# Patient Record
Sex: Female | Born: 1970 | Race: White | Hispanic: No | Marital: Married | Smoking: Former smoker
Health system: Southern US, Community
[De-identification: ages and names within clinical notes are randomized; demographics above are authoritative.]

## PROBLEM LIST (undated history)

## (undated) DIAGNOSIS — M1611 Unilateral primary osteoarthritis, right hip: Secondary | ICD-10-CM

## (undated) DIAGNOSIS — F53 Postpartum depression: Secondary | ICD-10-CM

## (undated) DIAGNOSIS — O99345 Other mental disorders complicating the puerperium: Secondary | ICD-10-CM

## (undated) HISTORY — DX: Other mental disorders complicating the puerperium: O99.345

## (undated) HISTORY — PX: LEEP: SHX91

## (undated) HISTORY — PX: OTHER SURGICAL HISTORY: SHX169

## (undated) HISTORY — DX: Postpartum depression: F53.0

## (undated) HISTORY — DX: Unilateral primary osteoarthritis, right hip: M16.11

---

## 2007-11-16 ENCOUNTER — Ambulatory Visit: Payer: Self-pay | Admitting: Family Medicine

## 2007-11-16 DIAGNOSIS — L851 Acquired keratosis [keratoderma] palmaris et plantaris: Secondary | ICD-10-CM | POA: Insufficient documentation

## 2007-11-16 DIAGNOSIS — K625 Hemorrhage of anus and rectum: Secondary | ICD-10-CM | POA: Insufficient documentation

## 2007-11-16 DIAGNOSIS — IMO0002 Reserved for concepts with insufficient information to code with codable children: Secondary | ICD-10-CM

## 2007-11-16 DIAGNOSIS — L259 Unspecified contact dermatitis, unspecified cause: Secondary | ICD-10-CM | POA: Insufficient documentation

## 2007-11-16 DIAGNOSIS — D509 Iron deficiency anemia, unspecified: Secondary | ICD-10-CM | POA: Insufficient documentation

## 2007-11-16 DIAGNOSIS — R42 Dizziness and giddiness: Secondary | ICD-10-CM

## 2007-11-17 ENCOUNTER — Encounter: Payer: Self-pay | Admitting: Family Medicine

## 2007-11-18 ENCOUNTER — Encounter: Payer: Self-pay | Admitting: Family Medicine

## 2007-11-25 LAB — CONVERTED CEMR LAB
Albumin: 4.4 g/dL (ref 3.5–5.2)
BUN: 12 mg/dL (ref 6–23)
CO2: 22 meq/L (ref 19–32)
Cholesterol: 206 mg/dL — ABNORMAL HIGH (ref 0–200)
Glucose, Bld: 91 mg/dL (ref 70–99)
HDL: 49 mg/dL (ref >39)
Hemoglobin: 12.6 g/dL (ref 12.0–15.0)
MCHC: 32.1 g/dL (ref 30.0–36.0)
MCV: 88.1 fL (ref 78.0–100.0)
Potassium: 4.3 meq/L (ref 3.5–5.3)
Sodium: 139 meq/L (ref 135–145)
Total Bilirubin: 0.5 mg/dL (ref 0.3–1.2)
Total Protein: 7.4 g/dL (ref 6.0–8.3)
Triglycerides: 78 mg/dL (ref <150)
WBC: 7.4 10*3/uL (ref 4.0–10.5)

## 2008-01-15 ENCOUNTER — Ambulatory Visit: Payer: Self-pay | Admitting: Family Medicine

## 2008-02-01 ENCOUNTER — Telehealth (INDEPENDENT_AMBULATORY_CARE_PROVIDER_SITE_OTHER): Payer: Self-pay | Admitting: *Deleted

## 2008-02-02 ENCOUNTER — Encounter: Admission: RE | Admit: 2008-02-02 | Discharge: 2008-02-02 | Payer: Self-pay | Admitting: Family Medicine

## 2008-02-02 ENCOUNTER — Ambulatory Visit: Payer: Self-pay | Admitting: Family Medicine

## 2008-05-25 ENCOUNTER — Ambulatory Visit: Payer: Self-pay | Admitting: Family Medicine

## 2009-01-11 ENCOUNTER — Ambulatory Visit: Payer: Self-pay | Admitting: Obstetrics & Gynecology

## 2009-01-18 ENCOUNTER — Encounter: Admission: RE | Admit: 2009-01-18 | Discharge: 2009-01-18 | Payer: Self-pay | Admitting: Family Medicine

## 2009-01-18 ENCOUNTER — Ambulatory Visit: Payer: Self-pay | Admitting: Family Medicine

## 2009-01-18 DIAGNOSIS — M79609 Pain in unspecified limb: Secondary | ICD-10-CM | POA: Insufficient documentation

## 2009-01-26 ENCOUNTER — Encounter: Payer: Self-pay | Admitting: Family Medicine

## 2009-03-15 ENCOUNTER — Ambulatory Visit: Payer: Self-pay | Admitting: Family Medicine

## 2009-03-15 DIAGNOSIS — M546 Pain in thoracic spine: Secondary | ICD-10-CM

## 2009-03-15 DIAGNOSIS — J020 Streptococcal pharyngitis: Secondary | ICD-10-CM

## 2009-03-29 ENCOUNTER — Encounter: Admission: RE | Admit: 2009-03-29 | Discharge: 2009-04-10 | Payer: Self-pay | Admitting: Family Medicine

## 2009-03-31 ENCOUNTER — Encounter: Payer: Self-pay | Admitting: Family Medicine

## 2009-04-07 ENCOUNTER — Encounter: Payer: Self-pay | Admitting: Family Medicine

## 2009-04-14 ENCOUNTER — Telehealth (INDEPENDENT_AMBULATORY_CARE_PROVIDER_SITE_OTHER): Payer: Self-pay | Admitting: *Deleted

## 2009-05-29 ENCOUNTER — Ambulatory Visit: Payer: Self-pay | Admitting: Family Medicine

## 2009-05-29 DIAGNOSIS — J309 Allergic rhinitis, unspecified: Secondary | ICD-10-CM | POA: Insufficient documentation

## 2009-12-18 ENCOUNTER — Ambulatory Visit: Payer: Self-pay | Admitting: Family Medicine

## 2010-02-06 NOTE — Consult Note (Signed)
Summary: [facility]  [facility]   Imported By: Lanelle Bal 02/08/2009 08:31:09  _____________________________________________________________________  External Attachment:    Type:   Image     Comment:   External Document

## 2010-02-06 NOTE — Assessment & Plan Note (Signed)
Summary: strep/ back pain   Vital Signs:  Patient profile:   40 year old female Height:      62 inches Weight:      167 pounds BMI:     30.66 O2 Sat:      98 % on Room air Temp:     99.5 degrees F oral Pulse rate:   83 / minute BP sitting:   107 / 71  (left arm) Cuff size:   regular  Vitals Entered By: Payton Spark CMA (March 15, 2009 10:53 AM)  O2 Flow:  Room air CC: Back pain down spine x 1.5 weeks. Also c/o ST and achy ears x 1 day, Back Pain Pain Assessment Patient in pain? yes     Location: back Intensity: 8   Primary Care Provider:  Seymour Bars DO  CC:  Back pain down spine x 1.5 weeks. Also c/o ST and achy ears x 1 day and Back Pain.  History of Present Illness: 40 yo WF presents for back pain and 1 day of subjective fevers, sore throat pain and fatigue.  No cough or congestion.  No strep exposure at home.         This is a 40 year old woman who presents with Back Pain.  The symptoms began 2 weeks ago.  The intensity is described as an 8 out of 10.  She was laying on the floor doing exercises and back pain started.  It was so bad, she almost passed out.  It has improved since then.  She did start Zumba recently and notes that her back hurts afterwards everytime.  She does stretch after Zumba Class.  She is taking only Tylenol.  The patient denies weakness, loss of sensation, urinary retention, and dysuria.  The pain began at home and suddenly.  The pain is made worse by flexion and inactivity.  The pain is made better by acetaminophen and heat.    Current Medications (verified): 1)  Pre-Natal  Tabs (Prenatal Multivit-Min-Fe-Fa) .... Take 1 Tablet By Mouth Three Times A Day 2)  Omega-3 & Omega-6 Fish Oil  Caps (Omega 3-6-9 Fatty Acids) .... Take 1 Tablet By Mouth Three Times A Day 3)  Skeletal Strength .... Take 1 Tablet By Mouth Three Times A Day 4)  Vitamin D 1000 Unit Tabs (Cholecalciferol) .... Take 1 Tab Once Daily  Allergies (verified): No Known Drug  Allergies  Past History:  Past Surgical History: Last updated: 11/16/2007 R TM graft after a perf in 2000  Social History: Last updated: 01/18/2009 [facility] with 4  kids (daughters 84 and 9), sons 2 & infant Married. Quit smoking 2004. 7 beers/ wk Cardio 2 x a wk. pull out method for birth control.  Review of Systems      See HPI  Physical Exam  General:  alert, well-nourished, well-hydrated, and overweight-appearing.   Head:  normocephalic and atraumatic.   Eyes:  conjunctiva clear Ears:  EACs patent; TMs translucent and gray with good cone of light and bony landmarks. R side TM scar, old Nose:  no nasal discharge.   Mouth:  good dentition and pharynx pink and moist.  o/p injected Neck:  no masses.   Lungs:  Normal respiratory effort, chest expands symmetrically. Lungs are clear to auscultation, no crackles or wheezes. Heart:  Normal rate and regular rhythm. S1 and S2 normal without gallop, murmur, click, rub or other extra sounds. Msk:  tender over L>R mid to lower thoracic musculature with full L  and T spine active ROM.  REproducible tenderness to touch with slight muscle spasm and with resisted arm movement. Pulses:  2+ radial pulses Neurologic:  gait normal and DTRs symmetrical and normal.   Skin:  color normal and no rashes.   Cervical Nodes:  shotty anterior cervical chain LA   Impression & Recommendations:  Problem # 1:  BACK PAIN, THORACIC REGION (ICD-724.1) MSK thoracic back pain from doing new exercises.  She is nursing which limits some medication options. Treat with NSAIDs, heat, icy hot and add PT to work on improving home exercise program w/o causing excess strain. Orders: Physical Therapy Referral (PT)  Problem # 2:  STREPTOCOCCAL SORE THROAT (ICD-034.0) Rapid strep +.  Treat with Pen VK x 10 days.  Call if symptoms are not improving in the next 4-5 days. Continue supportive care and avoid contact spread. Her updated medication list for this problem  includes:    Penicillin V Potassium 500 Mg Tabs (Penicillin v potassium) .Marland Kitchen... 1 tab by mouth two times a day x 10 days  Complete Medication List: 1)  Pre-natal Tabs (Prenatal multivit-min-fe-fa) .... Take 1 tablet by mouth three times a day 2)  Omega-3 & Omega-6 Fish Oil Caps (Omega 3-6-9 fatty acids) .... Take 1 tablet by mouth three times a day 3)  Skeletal Strength  .... Take 1 tablet by mouth three times a day 4)  Vitamin D 1000 Unit Tabs (Cholecalciferol) .... Take 1 tab once daily 5)  Penicillin V Potassium 500 Mg Tabs (Penicillin v potassium) .Marland Kitchen.. 1 tab by mouth two times a day x 10 days  Other Orders: Rapid Strep ([address])  Patient Instructions: 1)  Take Pen VK 2 x a day for strep throat for the full 10 days. 2)  Use ALEVE with Breakfast and dinner for both sore throat pain and back pain. 3)  Use heating pad, icy hot and will get you in with PT down the hall. 4)  Call if sore throat is not improving by Monday. Prescriptions: PENICILLIN V POTASSIUM 500 MG TABS (PENICILLIN V POTASSIUM) 1 tab by mouth two times a day x 10 days  #20 x 0   Entered and Authorized by:   Seymour Bars DO   Signed by:   Seymour Bars DO on 03/15/2009   Method used:   Electronically to        [id number] (retail)       [address]       [place], [place]  [address]       Ph: [phone]       Fax: [phone]   RxID:   [id number]   Laboratory Results    Other Tests  Rapid Strep: positive

## 2010-02-06 NOTE — Miscellaneous (Signed)
Summary: PT Initial Summary/[facility]  PT Initial Summary/[facility]   Imported By: Lanelle Bal 04/06/2009 08:59:10  _____________________________________________________________________  External Attachment:    Type:   Image     Comment:   External Document

## 2010-02-06 NOTE — Assessment & Plan Note (Signed)
Summary: CPE   Vital Signs:  Patient profile:   40 year old female Height:      62 inches Weight:      163 pounds BMI:     29.92 O2 Sat:      98 % on Room air Pulse rate:   61 / minute BP sitting:   110 / 64  (left arm) Cuff size:   regular  Vitals Entered By: Payton Spark CMA (May 29, 2009 10:42 AM)  O2 Flow:  Room air CC: CPE w/ out pap   Primary Care Provider:  Seymour Bars DO  CC:  CPE w/ out pap.  History of Present Illness: 40 yo WF presents for CPE w/o pap smear.  She is G4P4.  She is struggling with her allergies.  Having a lot of ocular symptoms.  Taking Claritin but it's not helping much.  Unsure last Tdap vaccine.  Sees Dr Marice Potter for pap smears.  Denies fam hx of breastk or colon cancer.  Denies fam hx of premature heart dz.  Due for fasting labs.  Vasectomy for birth control.    Current Medications (verified): 1)  Pre-Natal  Tabs (Prenatal Multivit-Min-Fe-Fa) .... Take 1 Tablet By Mouth Three Times A Day 2)  Omega-3 & Omega-6 Fish Oil  Caps (Omega 3-6-9 Fatty Acids) .... Take 1 Tablet By Mouth Three Times A Day 3)  Skeletal Strength .... Take 1 Tablet By Mouth Three Times A Day  Allergies (verified): No Known Drug Allergies  Past History:  Past Medical History: Reviewed history from 01/18/2009 and no changes required. Breeze.Bergeron  Family History: Reviewed history from 11/16/2007 and no changes required. father healthy mother skin cancer (non melanoma) 2 sisters and 1 brother healthy  Social History: Reviewed history from 01/18/2009 and no changes required. 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 Quit smoking 2004. 7 beers/ wk Cardio 2 x a wk. pull out method for birth control.  Review of Systems      See HPI  The patient denies anorexia, fever, weight loss, weight gain, vision loss, decreased hearing, hoarseness, chest pain, syncope, dyspnea on exertion, peripheral edema, prolonged cough, headaches, hemoptysis, abdominal pain,  melena, hematochezia, severe indigestion/heartburn, hematuria, incontinence, genital sores, muscle weakness, suspicious skin lesions, transient blindness, difficulty walking, depression, unusual weight change, abnormal bleeding, enlarged lymph nodes, angioedema, breast masses, and testicular masses.    Physical Exam  General:  alert, well-developed, well-nourished, well-hydrated, and overweight-appearing.   Head:  normocephalic and atraumatic.   Eyes:  bilat sclera injection with watery eyes Ears:  scarring on R TM; normal L TM Nose:  nasal congestion present.   Mouth:  good dentition and pharynx pink and moist.   Neck:  no masses.   Lungs:  Normal respiratory effort, chest expands symmetrically. Lungs are clear to auscultation, no crackles or wheezes. Heart:  Normal rate and regular rhythm. S1 and S2 normal without gallop, murmur, click, rub or other extra sounds. Abdomen:  soft, non-tender, normal bowel sounds, no distention, no masses, and no guarding.   Pulses:  2+ radial and pedal pulses Extremities:  no LE edema Skin:  color normal and no suspicious lesions.   Cervical Nodes:  No lymphadenopathy noted Psych:  good eye contact, not anxious appearing, and not depressed appearing.     Impression & Recommendations:  Problem # 1:  HEALTH MAINTENANCE EXAM (ICD-V70.0) Keeping healthy checklist for women reviewed. BP at goal.  BMI 29 c/w overwt. Paps done  by Dr Marice Potter. Mammogram at 40. Colon cancer screening at 40. Tdap declined today. Update fasting labs, order printed. Work on [employer], regular exercise, wt loss, MVI and calcium/ D daily. Contraception: vasectomy.  Problem # 2:  ALLERGIC RHINITIS (ICD-477.9) With acute seasonal flare ups. Change Claritin to Allegra OTC and add Zaditor drops for allergy eyes.  Complete Medication List: 1)  Pre-natal Tabs (Prenatal multivit-min-fe-fa) .... Take 1 tablet by mouth three times a day 2)  Omega-3 & Omega-6 Fish Oil Caps (Omega 3-6-9  fatty acids) .... Take 1 tablet by mouth three times a day 3)  Skeletal Strength  .... Take 1 tablet by mouth three times a day  Other Orders: T-Lipid Profile ([id number]) T-Comprehensive Metabolic Panel ([id number])  Patient Instructions: 1)  OK to change Claritin to Allegra (OTC) daily for allergies. 2)  Add OTC ZADITOR DROPS for allergy eyes. 3)  Return for follow up as needed.

## 2010-02-06 NOTE — Miscellaneous (Signed)
Summary: PT Discharge/[facility]  PT Discharge/[facility]   Imported By: Lanelle Bal 04/17/2009 10:26:17  _____________________________________________________________________  External Attachment:    Type:   Image     Comment:   External Document

## 2010-02-06 NOTE — Progress Notes (Signed)
Summary: Allergy med?  Phone Note Call from Patient   Caller: Patient Summary of Call: Pt has bad allergies and would like to know what she can take for allergies while she is breast feeding. Please advise.  Initial call taken by: Payton Spark CMA,  April 14, 2009 11:22 AM  Follow-up for Phone Call        Claritin is safe and is OTC and once a day.  Follow-up by: Nani Gasser MD,  April 14, 2009 11:24 AM  Additional Follow-up for Phone Call Additional follow up Details #1::        Pt aware Additional Follow-up by: Payton Spark CMA,  April 14, 2009 11:26 AM

## 2010-02-06 NOTE — Assessment & Plan Note (Signed)
Summary: L foot pain   Vital Signs:  Patient profile:   40 year old female Height:      62 inches Weight:      175 pounds BMI:     32.12 O2 Sat:      97 % on Room air Temp:     98.4 degrees F oral Pulse rate:   79 / minute BP sitting:   106 / 68  (left arm) Cuff size:   regular  Vitals Entered By: Payton Spark CMA (January 18, 2009 10:26 AM)  O2 Flow:  Room air CC: L foot pain    Primary Care Provider:  Seymour Bars DO  CC:  L foot pain .  History of Present Illness: 40 yo WF presents for L foot pain x 5 months.  Denies any trauma.  Denies previous injury.  It hurts worse after exercise.  It hurts first thing in the morning.  Denies joint pain.  She has started exercising again.  She did not gain much weight w/ pregnancy.  Her baby was born 4 months ago, Ivin Booty.  No brusing, no redness, no swelling. No pain in the middle of the night. Has not tried anything.   Current Medications (verified): 1)  Pre-Natal  Tabs (Prenatal Multivit-Min-Fe-Fa) .... Take 1 Tablet By Mouth Three Times A Day 2)  Omega-3 & Omega-6 Fish Oil  Caps (Omega 3-6-9 Fatty Acids) .... Take 1 Tablet By Mouth Three Times A Day 3)  Ix 450mg  .... Take 1 Tablet By Mouth Three Times A Day 4)  Skeletal Strength .... Take 1 Tablet By Mouth Three Times A Day 5)  Vitamin D 1000 Unit Tabs (Cholecalciferol) .... Take 1 Tab Once Daily  Allergies (verified): No Known Drug Allergies  Past History:  Past Medical History: G4P4  Past Surgical History: Reviewed history from 11/16/2007 and no changes required. 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 Quit smoking 2004. 7 beers/ wk Cardio 2 x a wk. pull out method for birth control.  Review of Systems      See HPI  Physical Exam  General:  alert, well-developed, well-nourished, and well-hydrated.  obese Head:  normocephalic and atraumatic.   Mouth:  good dentition and pharynx  pink and moist.   Msk:  tender at the L ATF ligament. no bruising, redness or edema. neg L ankle anterior drawer test. Dropped longitudinal arch with normal gait. No ankle laxity or point tenderness. Neg heel squeeze. fat herniation L heel Skin:  color normal.     Impression & Recommendations:  Problem # 1:  FOOT PAIN, LEFT (ICD-729.5) Non- traumatic L foot pain at rest x 6 months likely to be functional but given wt gain from pregnancy and her wanting to start back to vigorous exercise, will r/o a stress fx with an Xray today.  If normal, I will get her in with podiatry to have orthotics made as it appears with her longitudinal arch dropped that she is over supinating.  Orders: T-DG Foot 2 Views*L* ([address])  Complete Medication List: 1)  Pre-natal Tabs (Prenatal multivit-min-fe-fa) .... Take 1 tablet by mouth three times a day 2)  Omega-3 & Omega-6 Fish Oil Caps (Omega 3-6-9 fatty acids) .... Take 1 tablet by mouth three times a day 3)  Ix 450mg   .... Take 1 tablet by mouth three times a day 4)  Skeletal Strength  .... Take 1  tablet by mouth three times a day 5)  Vitamin D 1000 Unit Tabs (Cholecalciferol) .... Take 1 tab once daily  Patient Instructions: 1)  Xray foot today. 2)  Will call you w/ results. 3)  If negative for fracture, will get you in with podiatry to have orthotics made. 4)  Nutritionist #

## 2010-05-22 NOTE — Assessment & Plan Note (Signed)
NAME:  ELLENOR, WISNIEWSKI NO.:  [id number]   MEDICAL RECORD NO.:  [id number]          PATIENT TYPE:  POB   LOCATION:  [facility] [place]         FACILITY:  [facility]   PHYSICIAN:  Allie Bossier, MD        DATE OF BIRTH:  1970/07/12   DATE OF SERVICE:                                  CLINIC NOTE   IDENTIFICATION:  Ms. Moultry is a 40 year old married white gravida 4,  para 4 with children ranging in ages from 3-1/2 months to 38 years of  age.  She comes in here for an annual exam.  She has no particular  complaints.   PAST MEDICAL HISTORY:  She is obese, but denies other medical problems.   PAST SURGICAL HISTORY:  She had a LEEP in 1993.   SOCIAL HISTORY:  She reports 1-2 beers per week.  Denies tobacco or  illegal drug use.   ALLERGIES:  No latex allergies.  No known drug allergies.   MEDICATIONS:  She is taking a multivitamin daily along with Skeletal  Strength vitamin and omega 3 vitamins.   FAMILY HISTORY:  Negative for breast, GYN and colon malignancies.   REVIEW OF SYSTEMS:  She has been married for 13 years.  She denies  dyspareunia.  She is a Futures trader.  She has had 2 home births.  Her  husband had a vasectomy recently and they are using condoms at the  present time.  Her last Pap smear was in approximately 2008 done at  [facility].   PHYSICAL EXAMINATION:  GENERAL:  Well-nourished, well-hydrated white  female.  VITAL SIGNS:  Height 5 feet 1 inches, weight 177 pounds, blood pressure  125/78, pulse 84.  HEENT:  Normal.  HEART:  Regular rate and rhythm.  LUNGS:  Clear to auscultation bilaterally.  BREASTS:  Normal breastfeeding breast.  ABDOMEN:  Obese.  No palpable hepatosplenomegaly.  PELVIC:  External genitalia, no lesions.  Cervix; parous, no lesions,  normal discharge.  Uterus; mid plane, difficult to exam secondary to  centripetal obesity.  Adnexa, no masses and nontender.   ASSESSMENT AND PLAN:  Annual exam.  I have checked Pap smear and  recommended self-breast and self-vulvar exams.  I have recommended  weight loss.  She says that she is currently going to a gym.      Allie Bossier, MD     MCD/MEDQ  D:  01/11/2009  T:  01/12/2009  Job:  [id number]

## 2010-12-18 ENCOUNTER — Encounter: Payer: Self-pay | Admitting: Family Medicine

## 2011-01-07 ENCOUNTER — Other Ambulatory Visit (HOSPITAL_COMMUNITY)
Admission: RE | Admit: 2011-01-07 | Discharge: 2011-01-07 | Disposition: A | Discharge status: Home / Self Care | Payer: Self-pay | Source: Ambulatory Visit | Attending: Family Medicine | Admitting: Family Medicine

## 2011-01-07 ENCOUNTER — Encounter: Payer: Self-pay | Admitting: Family Medicine

## 2011-01-07 ENCOUNTER — Ambulatory Visit (INDEPENDENT_AMBULATORY_CARE_PROVIDER_SITE_OTHER): Payer: Self-pay | Admitting: Family Medicine

## 2011-01-07 VITALS — BP 105/60 | HR 84 | Ht 62.0 in | Wt 157.0 lb

## 2011-01-07 DIAGNOSIS — Z01419 Encounter for gynecological examination (general) (routine) without abnormal findings: Secondary | ICD-10-CM | POA: Insufficient documentation

## 2011-01-07 NOTE — Patient Instructions (Signed)
complete physical examination Start a regular exercise program and make sure you are eating a healthy diet Try to eat 4 servings of dairy a day or take a calcium supplement (500mg  twice a day). We will call you with your lab results. If you don't here from [place] in about a week then please give [place] a call at [phone].

## 2011-01-07 NOTE — Progress Notes (Signed)
  Subjective:     Arihanna Estabrook is a 40 y.o. female and is here for a comprehensive physical exam. The patient reports no problems.Normal periods. Had a LEEP in 2002. Normal paps since then.  No hx of anemia.   History   Social History  . Marital Status: Married    Spouse Name: Fayrene Fearing     Number of Children: 4   . Years of Education: N/A   Occupational History  . Post office     Social History Main Topics  . Smoking status: Former Smoker    Quit date: 01/08/2000  . Smokeless tobacco: Not on file  . Alcohol Use: Yes  . Drug Use: No  . Sexually Active: Not on file   Other Topics Concern  . Not on file   Social History Narrative   Dentist.  Caffeine 2 daily.    Health Maintenance  Topic Date Due  . Pap Smear  11/03/1990  . Influenza Vaccine  10/08/2011  . Tetanus/tdap  01/06/2021    The following portions of the patient's history were reviewed and updated as appropriate: allergies, current medications, past family history, past medical history, past social history, past surgical history and problem list.  Review of Systems A comprehensive review of systems was negative.   Objective:    BP 105/60  Pulse 84  Ht 5\' 2"  (1.575 m)  Wt 157 lb (71.215 kg)  BMI 28.72 kg/m2  LMP 12/31/2010 General appearance: alert, cooperative and appears stated age Head: Normocephalic, without obvious abnormality, atraumatic Eyes: conj clear, EOMi, PEERLA Ears: normal TM's and external ear canals both ears Nose: Nares normal. Septum midline. Mucosa normal. No drainage or sinus tenderness. Throat: lips, mucosa, and tongue normal; teeth and gums normal Neck: no adenopathy, no carotid bruit, no JVD, supple, symmetrical, trachea midline and thyroid not enlarged, symmetric, no tenderness/mass/nodules Back: symmetric, no curvature. ROM normal. No CVA tenderness. Lungs: clear to auscultation bilaterally Breasts: normal appearance, no masses or tenderness Heart: regular rate and  rhythm, S1, S2 normal, no murmur, click, rub or gallop Abdomen: soft, non-tender; bowel sounds normal; no masses,  no organomegaly Pelvic: cervix normal in appearance, external genitalia normal, no adnexal masses or tenderness, no cervical motion tenderness, rectovaginal septum normal, uterus normal size, shape, and consistency and vagina normal without discharge Extremities: extremities normal, atraumatic, no cyanosis or edema Pulses: 2+ and symmetric Skin: Skin color, texture, turgor normal. No rashes or lesions Lymph nodes: Cervical, supraclavicular, and axillary nodes normal. Neurologic: Grossly normal    Assessment:   Healthy female exam.     Plan:     See After Visit Summary for Counseling Recommendations  Start a regular exercise program and make sure you are eating a healthy diet Try to eat 4 servings of dairy a day or take a calcium supplement (500mg  twice a day). Declined vaccines.  Given slip for screening labs.

## 2011-01-22 ENCOUNTER — Telehealth: Payer: Self-pay | Admitting: Family Medicine

## 2011-01-22 DIAGNOSIS — E663 Overweight: Secondary | ICD-10-CM

## 2011-01-22 NOTE — Telephone Encounter (Signed)
Referral entered  

## 2011-01-22 NOTE — Telephone Encounter (Signed)
Pt. States that her and the Dr. Discussed her having a nutritional consult and we just need a referral put in to get this ordered. Thanks, [employer]

## 2011-07-05 ENCOUNTER — Encounter: Payer: Self-pay | Admitting: *Deleted

## 2011-07-08 ENCOUNTER — Encounter: Payer: Self-pay | Admitting: Family Medicine

## 2011-07-08 ENCOUNTER — Ambulatory Visit (INDEPENDENT_AMBULATORY_CARE_PROVIDER_SITE_OTHER): Payer: BC Managed Care – PPO

## 2011-07-08 ENCOUNTER — Ambulatory Visit (INDEPENDENT_AMBULATORY_CARE_PROVIDER_SITE_OTHER): Payer: BC Managed Care – PPO | Admitting: Family Medicine

## 2011-07-08 VITALS — BP 112/65 | HR 65 | Ht 62.0 in | Wt 148.0 lb

## 2011-07-08 DIAGNOSIS — M79646 Pain in unspecified finger(s): Secondary | ICD-10-CM

## 2011-07-08 DIAGNOSIS — M76899 Other specified enthesopathies of unspecified lower limb, excluding foot: Secondary | ICD-10-CM

## 2011-07-08 DIAGNOSIS — M79609 Pain in unspecified limb: Secondary | ICD-10-CM

## 2011-07-08 DIAGNOSIS — M25559 Pain in unspecified hip: Secondary | ICD-10-CM

## 2011-07-08 DIAGNOSIS — M7060 Trochanteric bursitis, unspecified hip: Secondary | ICD-10-CM

## 2011-07-08 NOTE — Progress Notes (Signed)
Subjective:    Patient ID: Julia Griffith, female    DOB: 1970/07/05, 41 y.o.   MRN: [id number]  HPI Bilateral thumb joints are sore and stiff. 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  Also c/o of bilat hip pain, worse on the right.  Pain on the goin crease on the right, will occ be sharp. Worse when walking.  Also some pain on the outside of her hips. Will occ use Advil -not sure if helping. No swelling or redness either.  Was told had bursitis in right hip years ago. Given injection adn therapy and that helped.  Has been working out regularly and has been doing yoga as well. Has limited rom on the right hip.    Review of Systems     Objective:   Physical Exam  Constitutional: She appears well-developed and well-nourished.  Musculoskeletal:       Right hip with decreased range of motion. She has pain with full flexion. Just has pain with internal and external rotation. She cannot fully externally rotate. Her left hip has normal wrist motion. She's very tender over birth with bilateral trochanteric bursa. More tender on the left compared to the right. Hands with normal range of motion. Fingers with normal strength 5 over 5 bilaterally. No pressure no swelling over any of the joints. Negative for de Quervain's tenosynovitis. No redness or any the joints. She is tender over both of the proximal and distal thumb joints.          Assessment & Plan:  Bilat thumb pain-likely osteoarthritis based on exam. I do not suspect or see any evidence of tendinitis at this point time. She's actually tender of the actual joint but there is no significant swelling or erythema. I think rheumatoid arthritis is less likely. No family history of this. She also does not have a constellation of symptoms that are most consistent with that. I would like to get plain film x-rays today for further evaluation.  Hip pain- likely osteoarthritis of the right hip. I would like to get  an x-ray today. She may have some early arthritis on the left as well though symptoms were definitely worse on the right. Consider referral for injection if she does have significant signs on her x-ray. In the meantime she can certainly use Tylenol or ibuprofen as needed. Be careful GI side effects with NSAIDs.  Bilat trochanteric bursitis - she has had trochanteric bursitis on the right several years ago. She said she responded well to injections. She opted to have both hips injected today. Patient tolerated the procedure well. I gave her handout on some stretches to start in the next 2-3 days. Call if any problems or signs of infection at the injection site.  Bursa Injection Procedure Note  Pre-operative Diagnosis: bilateral Trochanteric bursitis  Post-operative Diagnosis: same  Indications: Diagnosis and treatment of symptomatic bursal effusion  Anesthesia: Lidocaine 1% without epinephrine without added sodium bicarbonate  Procedure Details   After a discussion of the risks and benefits with the patient (including the possibility that any manipulation of the bursa could introduce infection, worsening the current situation significantly), verbal consent was obtained for the procedure. The joint was prepped with Betadine.An 18 gauge needle was introduced into the fluid collection.  One mL of 40 mg of Kenalog mixed with 9 mL's of 1% lidocaine without epinephrine was injected into the bursa. The injection site was cleansed with topical isopropyl alcohol and a dressing was  applied.  Complications:  None; patient tolerated the procedure well.

## 2011-07-08 NOTE — Patient Instructions (Signed)

## 2011-07-09 ENCOUNTER — Other Ambulatory Visit: Payer: Self-pay | Admitting: Family Medicine

## 2011-07-09 DIAGNOSIS — M169 Osteoarthritis of hip, unspecified: Secondary | ICD-10-CM

## 2011-07-19 ENCOUNTER — Other Ambulatory Visit: Payer: Self-pay | Admitting: Family Medicine

## 2011-07-19 DIAGNOSIS — M169 Osteoarthritis of hip, unspecified: Secondary | ICD-10-CM

## 2012-04-10 ENCOUNTER — Encounter: Payer: Self-pay | Admitting: *Deleted

## 2012-10-12 ENCOUNTER — Other Ambulatory Visit: Payer: Self-pay | Admitting: Sports Medicine

## 2012-10-12 DIAGNOSIS — M19041 Primary osteoarthritis, right hand: Secondary | ICD-10-CM

## 2012-10-19 ENCOUNTER — Ambulatory Visit (INDEPENDENT_AMBULATORY_CARE_PROVIDER_SITE_OTHER): Payer: BC Managed Care – PPO | Admitting: Sports Medicine

## 2012-10-19 ENCOUNTER — Ambulatory Visit (INDEPENDENT_AMBULATORY_CARE_PROVIDER_SITE_OTHER): Payer: BC Managed Care – PPO

## 2012-10-19 ENCOUNTER — Encounter: Payer: Self-pay | Admitting: Sports Medicine

## 2012-10-19 VITALS — BP 114/67 | HR 62 | Wt 143.0 lb

## 2012-10-19 DIAGNOSIS — M19041 Primary osteoarthritis, right hand: Secondary | ICD-10-CM

## 2012-10-19 DIAGNOSIS — M79609 Pain in unspecified limb: Secondary | ICD-10-CM

## 2012-10-19 DIAGNOSIS — M19049 Primary osteoarthritis, unspecified hand: Secondary | ICD-10-CM

## 2012-10-19 DIAGNOSIS — M1611 Unilateral primary osteoarthritis, right hip: Secondary | ICD-10-CM | POA: Insufficient documentation

## 2012-10-19 DIAGNOSIS — M169 Osteoarthritis of hip, unspecified: Secondary | ICD-10-CM

## 2012-10-19 DIAGNOSIS — M1610 Unilateral primary osteoarthritis, unspecified hip: Secondary | ICD-10-CM

## 2012-10-19 HISTORY — DX: Unilateral primary osteoarthritis, right hip: M16.11

## 2012-10-19 MED ORDER — OMEGA-3-ACID ETHYL ESTERS 1 G PO CAPS: 2.0000 g | ORAL_CAPSULE | Freq: Three times a day (TID) | ORAL | Status: DC

## 2012-10-19 MED ORDER — DICLOFENAC SODIUM 1 % TD GEL: 2.0000 g | Freq: Four times a day (QID) | TRANSDERMAL | Status: DC

## 2012-10-19 NOTE — Progress Notes (Signed)
   Subjective:    I'm seeing this patient as a consultation for:  Dr. Linford Arnold  CC: Hip pain, and hand pain  HPI: Hand arthritis: pain is localized at the right second proximal interphalangeal joint, it gets stiff in the morning, she does not like using the oral medications, and has been trying to use natural methods only, pain is localized, doesn't radiate, moderate. She occasionally also has pain at the metacarpophalangeal joints of both thumbs.  Right hip pain: Localized in the groin, worse with weightbearing, x-rays in the past has confirmed osteoarthritis.  Past medical history, Surgical history, Family history not pertinant except as noted below, Social history, Allergies, and medications have been entered into the medical record, reviewed, and no changes needed.   Review of Systems: No headache, visual changes, nausea, vomiting, diarrhea, constipation, dizziness, abdominal pain, skin rash, fevers, chills, night sweats, weight loss, swollen lymph nodes, body aches, joint swelling, muscle aches, chest pain, shortness of breath, mood changes, visual or auditory hallucinations.   Objective:   General: Well Developed, well nourished, and in no acute distress.  Neuro/Psych: Alert and oriented x3, extra-ocular muscles intact, able to move all 4 extremities, sensation grossly intact. Skin: Warm and dry, no rashes noted.  Respiratory: Not using accessory muscles, speaking in full sentences, trachea midline.  Cardiovascular: Pulses palpable, no extremity edema. Abdomen: Does not appear distended. Hands: Exam is overall unremarkable with only minimal tenderness to palpation at the right second proximal interphalangeal joint as well as the metacarpophalangeal joints of the thumbs. Right Hip: Moderate pain with internal rotation. Pelvic alignment unremarkable to inspection and palpation. Standing hip rotation and gait without trendelenburg sign / unsteadiness. Greater trochanter without  tenderness to palpation. No tenderness over piriformis and greater trochanter. No pain with FABER or FADIR. No SI joint tenderness and normal minimal SI movement.  X-rays of the hip show moderate osteoarthritis. Impression and Recommendations:   This case required medical decision making of moderate complexity.

## 2012-10-19 NOTE — Assessment & Plan Note (Signed)
She desires to avoid any oral medication. I have recommended 2000 mg of omega-3 fatty acids 3 times per day, these are anti-inflammatory fatty acids. She will do home hip flexor exercises. She can come back to see me for consideration of intra-articular injection if no better in one month.

## 2012-10-19 NOTE — Assessment & Plan Note (Signed)
Pain is worse to the right second proximal interphalangeal joint, as well as the metacarpophalangeal joints of the thumbs. She desires to avoid any oral medication. I have recommended 2000 mg of omega-3 fatty acids 3 times per day, these are anti-inflammatory fatty acids. She will do home exercises with a stress ball. Icing 20 minutes 3-4 times per day, Voltaren gel. She can come back to see me for consideration of intra-articular injection if no better in one month.

## 2012-11-26 ENCOUNTER — Ambulatory Visit: Payer: BC Managed Care – PPO | Admitting: Sports Medicine

## 2012-12-17 LAB — CALCIUM
Calcium: 9.1 mg/dL
Chloride: 104 mmol/L

## 2012-12-17 LAB — HEPATIC FUNCTION PANEL: Alkaline Phosphatase: 41 U/L (ref 25–125)

## 2012-12-17 LAB — BASIC METABOLIC PANEL
Glucose: 79 mg/dL
Potassium: 4 mmol/L (ref 3.4–5.3)
Sodium: 137 mmol/L (ref 137–147)

## 2012-12-17 LAB — CBC AND DIFFERENTIAL
Platelets: 234 10*3/uL (ref 150–399)
WBC: 8.4 10^3/mL

## 2012-12-25 ENCOUNTER — Encounter: Payer: Self-pay | Admitting: *Deleted

## 2012-12-29 ENCOUNTER — Encounter: Payer: Self-pay | Admitting: *Deleted

## 2013-02-16 ENCOUNTER — Other Ambulatory Visit: Payer: Self-pay | Admitting: Sports Medicine

## 2013-02-16 MED ORDER — DICLOFENAC SODIUM 2 % TD SOLN: 4.0000 [drp] | Freq: Four times a day (QID) | TRANSDERMAL | Status: DC | PRN

## 2014-03-16 ENCOUNTER — Ambulatory Visit (INDEPENDENT_AMBULATORY_CARE_PROVIDER_SITE_OTHER): Payer: BLUE CROSS/BLUE SHIELD | Admitting: Family Medicine

## 2014-03-16 ENCOUNTER — Encounter: Payer: Self-pay | Admitting: Family Medicine

## 2014-03-16 ENCOUNTER — Other Ambulatory Visit (HOSPITAL_COMMUNITY)
Admission: RE | Admit: 2014-03-16 | Discharge: 2014-03-16 | Disposition: A | Discharge status: Home / Self Care | Payer: BLUE CROSS/BLUE SHIELD | Source: Ambulatory Visit | Attending: Family Medicine | Admitting: Family Medicine

## 2014-03-16 VITALS — BP 106/64 | HR 52

## 2014-03-16 DIAGNOSIS — Z1151 Encounter for screening for human papillomavirus (HPV): Secondary | ICD-10-CM | POA: Diagnosis present

## 2014-03-16 DIAGNOSIS — Z01419 Encounter for gynecological examination (general) (routine) without abnormal findings: Secondary | ICD-10-CM

## 2014-03-16 DIAGNOSIS — Z114 Encounter for screening for human immunodeficiency virus [HIV]: Secondary | ICD-10-CM

## 2014-03-16 NOTE — Progress Notes (Signed)
Subjective:     Julia Griffith is a 44 y.o. female and is here for a comprehensive physical exam. The patient reports no problems. She did have surgery on her right foot about a year and a half ago for a neuroma. Unfortunately she started to get pain in that area again. She also noted that one of her toenails fell off several months ago and has grown back in. Now the toenail on the second toe is starting to come off. She wants to know if this will keep happening or if it will resolve. She denies any thickness or yellowing of the nail to indicate fungal infection.  History   Social History  . Marital Status: Married    Spouse Name: Fayrene FearingJames   . Number of Children: 4   . Years of Education: N/A   Occupational History  . [employer]     Social History Main Topics  . Smoking status: Former Smoker    Quit date: 01/08/2000  . Smokeless tobacco: Not on file  . Alcohol Use: Yes  . Drug Use: No  . Sexual Activity: Not on file   Other Topics Concern  . 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  Caffeine 2 daily.    Health Maintenance  Topic Date Due  . HIV Screening  11/03/1987  . INFLUENZA VACCINE  08/07/2013  . PAP SMEAR  01/06/2014  . TETANUS/TDAP  01/06/2021    The following portions of the patient's history were reviewed and updated as appropriate: allergies, current medications, past family history, past medical history, past social history, past surgical history and problem list.  Review of Systems A comprehensive review of systems was negative.   Objective:    BP 106/64 mmHg  Pulse 52  Wt  General appearance: alert, cooperative and appears stated age Head: Normocephalic, without obvious abnormality, atraumatic Eyes: conj claer, EOMi, PEERLA Ears: normal TM's and external ear canals both ears Nose: Nares normal. Septum midline. Mucosa normal. No drainage or sinus tenderness. Throat: lips, mucosa, and tongue normal; teeth and gums normal Neck: no  adenopathy, no carotid bruit, no JVD, supple, symmetrical, trachea midline and thyroid not enlarged, symmetric, no tenderness/mass/nodules Back: symmetric, no curvature. ROM normal. No CVA tenderness. Lungs: clear to auscultation bilaterally Breasts: normal appearance, no masses or tenderness Heart: regular rate and rhythm, S1, S2 normal, no murmur, click, rub or gallop Abdomen: soft, non-tender; bowel sounds normal; no masses,  no organomegaly Pelvic: cervix normal in appearance, external genitalia normal, no adnexal masses or tenderness, no cervical motion tenderness, rectovaginal septum normal, uterus normal size, shape, and consistency and vagina normal without discharge Extremities: extremities normal, atraumatic, no cyanosis or edema Pulses: 2+ and symmetric Skin: Skin color, texture, turgor normal. No rashes or lesions Lymph nodes: Cervical, supraclavicular, and axillary nodes normal. Neurologic: Alert and oriented X 3, normal strength and tone. Normal symmetric reflexes. Normal coordination and gait    Assessment:    Healthy female exam.      Plan:     See After Visit Summary for Counseling Recommendations  Keep up a regular exercise program and make sure you are eating a healthy diet Try to eat 4 servings of dairy a day, or if you are lactose intolerant take a calcium with vitamin D daily.  Your vaccines are up to date.   The toenail falling off [place] from trauma. She does work out and exercise regularly so this can sometimes cause this to happen. It may  also be postsurgical though typically this happens within about 6 months after surgery. She has her meals pain in today's it's a little bit difficult to sell there's any sign of thickening or nail fungus. But certainly if she notices any sign she can let me know. I would encourage her just to see if new nail starts to grow back in area but I would suspect that it should not fall off again.

## 2014-03-21 LAB — CYTOLOGY - PAP

## 2014-03-22 NOTE — Progress Notes (Signed)
Quick Note:  Call patient: Your Pap smear is normal. Repeat in 5 years. ______ 

## 2014-03-25 ENCOUNTER — Telehealth: Payer: Self-pay | Admitting: *Deleted

## 2014-03-25 LAB — COMPLETE METABOLIC PANEL WITH GFR
ALT: 12 U/L (ref 0–35)
AST: 15 U/L (ref 0–37)
Albumin: 3.9 g/dL (ref 3.5–5.2)
Alkaline Phosphatase: 49 U/L (ref 39–117)
BILIRUBIN TOTAL: 0.6 mg/dL (ref 0.2–1.2)
BUN: 15 mg/dL (ref 6–23)
CHLORIDE: 105 meq/L (ref 96–112)
CO2: 25 meq/L (ref 19–32)
Calcium: 8.8 mg/dL (ref 8.4–10.5)
Creat: 0.76 mg/dL (ref 0.50–1.10)
GFR, Est Non African American: >89 mL/min
Glucose, Bld: 77 mg/dL (ref 70–99)
Potassium: 4.4 mEq/L (ref 3.5–5.3)
SODIUM: 137 meq/L (ref 135–145)
TOTAL PROTEIN: 6.9 g/dL (ref 6.0–8.3)

## 2014-03-25 LAB — LIPID PANEL
CHOLESTEROL: 165 mg/dL (ref 0–200)
HDL: 43 mg/dL — AB (ref >=46)
LDL CALC: 110 mg/dL — AB (ref 0–99)
TRIGLYCERIDES: 60 mg/dL (ref <150)
Total CHOL/HDL Ratio: 3.8 Ratio
VLDL: 12 mg/dL (ref 0–40)

## 2014-03-25 LAB — HIV ANTIBODY (ROUTINE TESTING W REFLEX): HIV 1&2 Ab, 4th Generation: NONREACTIVE

## 2014-03-25 NOTE — Telephone Encounter (Signed)
Called patient w/ lab results.. Julia Griffith

## 2014-03-25 NOTE — Telephone Encounter (Signed)
-----   Message from Agapito Gamesatherine D Metheney, MD sent at 03/25/2014  7:52 AM EDT ----- Call pt: cholesterol just borderline. Looks better compared to last time. CMP is normal.  HIV is neg.

## 2014-08-19 ENCOUNTER — Ambulatory Visit (INDEPENDENT_AMBULATORY_CARE_PROVIDER_SITE_OTHER): Payer: BLUE CROSS/BLUE SHIELD | Admitting: Family Medicine

## 2014-08-19 ENCOUNTER — Ambulatory Visit (INDEPENDENT_AMBULATORY_CARE_PROVIDER_SITE_OTHER): Payer: BLUE CROSS/BLUE SHIELD

## 2014-08-19 ENCOUNTER — Encounter: Payer: Self-pay | Admitting: Family Medicine

## 2014-08-19 ENCOUNTER — Other Ambulatory Visit: Payer: Self-pay | Admitting: Family Medicine

## 2014-08-19 VITALS — BP 124/70 | HR 67 | Temp 98.5°F | Wt 140.0 lb

## 2014-08-19 DIAGNOSIS — R05 Cough: Secondary | ICD-10-CM

## 2014-08-19 DIAGNOSIS — Z2089 Contact with and (suspected) exposure to other communicable diseases: Secondary | ICD-10-CM

## 2014-08-19 DIAGNOSIS — Z20818 Contact with and (suspected) exposure to other bacterial communicable diseases: Secondary | ICD-10-CM | POA: Insufficient documentation

## 2014-08-19 DIAGNOSIS — R059 Cough, unspecified: Secondary | ICD-10-CM

## 2014-08-19 MED ORDER — AZITHROMYCIN 250 MG PO TABS: 250.0000 mg | ORAL_TABLET | Freq: Every day | ORAL | Status: DC

## 2014-08-19 NOTE — Patient Instructions (Signed)
Thank you for coming in today. Call or go to the emergency room if you get worse, have trouble breathing, have chest pains, or palpitations.  Take azithromycin.   Pertussis Pertussis (whooping cough) is an infection that causes severe and sudden coughing attacks. CAUSES  Pertussis is caused by bacteria. It is very contagious and spreads to others by the droplets sprayed in the air when an infected person talks, coughs, and sneezes. You may have caught pertussis from inhaling these droplets or from touching a surface where the droplets fell and then touching your mouth or nose. SIGNS AND SYMPTOMS  Early during this infection, symptoms of pertussis are similar to those of the common cold. They include a runny nose, low fever, mild cough, and red, watery eyes. After 1-2 weeks the cold symptoms get better, but the cough worsens and severe and sudden coughing attacks frequently develop. During these attacks people may cough so hard that vomiting occurs. Over the next month to 6 weeks, the cough starts to get better, but it may take as long as 6 months for the cough to go away completely. DIAGNOSIS  Your health care provider will perform a physical exam. The health care provider may take a mucus sample from the nose and throat and a blood sample to help confirm the diagnosis. The health care provider may also take a chest X-ray. TREATMENT  Antibiotic medicines are usually prescribed for this infection. Starting antibiotics quickly may help shorten the illness and make it less contagious. Antibiotics may also be prescribed for everyone living in the same household. Immunization may be recommended for those in the household at risk of developing pertussis. At-risk groups include:  Infants.  Those who have not had their full course of pertussis immunizations.  Those who were immunized but have not had their recent booster shot. Mild coughing may continue for months after the infection is treated from the  remaining soreness and inflammation in the lungs. HOME CARE INSTRUCTIONS   If you were prescribed an antibiotic medicine, finish it all even if you start to feel better.  Do not take cough medicine unless prescribed by your health care provider. Coughing is a protective mechanism that helps keep colored mucus (sputum) and secretions from clogging breathing passages.  Stay away from those who are at risk of developing pertussis for the first 5 days of antibiotic treatment. If no antibiotics are prescribed, stay at home for the first 3 weeks you are coughing.  Do not go to work until you have been treated with antibiotics for 5 days. If no antibiotics are prescribed, do not go to work for the first 3 weeks you are coughing. Inform your workplace that you were diagnosed with pertussis.  Wash your hands often. Those living in the same household should also wash their hands often to avoid spreading the infection.  Avoid substances that may irritate the lungs, such as smoke, aerosols, or fumes. These substances may worsen your coughing.  If you are having a coughing attack:  Raise the head of your mattress to help clear sputum more easily and improve breathing.  Sit upright.  Use a cool mist humidifier at home to increase air moisture. This will soothe your cough and help loosen sputum. Do not use hot steam.  Rest as much as possible. Normal activity may be gradually resumed.  Drink enough fluids to keep your urine clear or pale yellow. PREVENTION  Pertussis can be prevented with a vaccine and later booster shots. The pertussis  vaccine is usually given during childhood. Adults who were not previously vaccinated should be vaccinated as soon as possible. Adults who were previously vaccinated should talk to their health care providers about the need for a booster shot because immunity from the vaccine decreases over time. All of the following persons should consider receiving a booster dose of  pertussis, which is combined with tetanus and diphtheria (Tdap) vaccine:  Pregnant women during each pregnancy, preferably at 27-36 weeks of pregnancy (gestation).  All persons who have or will have close contact with an infant aged less than 12 months. Infants are at highest risk for life-threatening complications from pertussis.  All health care personnel. SEEK MEDICAL CARE IF:   You have persistent vomiting.  You are not able to eat or drink fluids.  You do not seem to be improving.  You have a fever. SEEK IMMEDIATE MEDICAL CARE IF:   Your face turns red or blue during a coughing attack.  You become unconscious after a coughing attack, even if only for a few moments.  Your breathing stops for a period of time (apnea).  You are restless or cannot sleep.  You are listless or sleeping too much. MAKE SURE YOU:  Understand these instructions.   Will watch your condition.   Will get help right away if you are not doing well or get worse. Document Released: 04/20/2012 Document Revised: 05/10/2013 Document Reviewed: 04/20/2012 [facility] Patient Information 2015 [place], [place]. This information is not intended to replace advice given to you by your health care provider. Make sure you discuss any questions you have with your health care provider.

## 2014-08-19 NOTE — Assessment & Plan Note (Signed)
Cough with pertussis exposure. Pertussis PCR pending. Empiric treatment with azithromycin. X-ray pending. Patient declines symptomatic codeine containing cough syrup. Precautions reviewed with pertussis. Recommend the entire family, to be evaluated
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
## 2014-08-24 ENCOUNTER — Telehealth: Payer: Self-pay | Admitting: Family Medicine

## 2014-08-24 NOTE — Telephone Encounter (Signed)
Notified by after hours service that patient tested positive for pertussis.  Since she had already been encouraged to start azithromycin no further action needed at this time. Routing to Dr. Denyse Amass and PCP.

## 2014-08-25 NOTE — Progress Notes (Signed)
Quick Note:  I think Julia Griffith already dealt with this. Please contact the patient if she has not been notified. ______

## 2014-08-25 NOTE — Telephone Encounter (Signed)
Spoke with PCP, advised no change in care. Will need to report to [facility].  Contacted Pt regarding positive results, states she has completed her antibiotic treatment. Informed I would be in contact with the Health Dept and they would determine if entire family needs to be tested. Pt lives at home with her husband and 4 children, two children have experienced symptoms but she is refusing antibiotic treatment for them. Advised I would contact her back regarding next steps after I am able to contact the Health Dept. Verbalized understanding. No further questions at this time.

## 2014-09-04 LAB — BORDETELLA PERTUSSIS PCR
B PARAPERTUSSIS, DNA: NOT DETECTED
B PERTUSSIS, DNA: DETECTED — AB

## 2014-09-04 LAB — CULTURE, BORDETELLA PERTUSSIS

## 2015-03-17 ENCOUNTER — Encounter: Payer: Self-pay | Admitting: Family Medicine

## 2015-03-17 ENCOUNTER — Ambulatory Visit (INDEPENDENT_AMBULATORY_CARE_PROVIDER_SITE_OTHER): Payer: BLUE CROSS/BLUE SHIELD | Admitting: Family Medicine

## 2015-03-17 VITALS — BP 115/61 | HR 76 | Wt 157.0 lb

## 2015-03-17 DIAGNOSIS — Z0000 Encounter for general adult medical examination without abnormal findings: Secondary | ICD-10-CM

## 2015-03-17 DIAGNOSIS — M25511 Pain in right shoulder: Secondary | ICD-10-CM | POA: Diagnosis not present

## 2015-03-17 DIAGNOSIS — M1611 Unilateral primary osteoarthritis, right hip: Secondary | ICD-10-CM | POA: Diagnosis not present

## 2015-03-17 NOTE — Patient Instructions (Addendum)
complete physical examination Keep up a regular exercise program and make sure you are eating a healthy diet Try to eat 4 servings of dairy a day, or if you are lactose intolerant take a calcium with vitamin D daily.  Your vaccines are up to date.   Impingement Syndrome, Rotator Cuff, Bursitis With Rehab Impingement syndrome is a condition that involves inflammation of the tendons of the rotator cuff and the subacromial bursa, that causes pain in the shoulder. The rotator cuff consists of four tendons and muscles that control much of the shoulder and upper arm function. The subacromial bursa is a fluid filled sac that helps reduce friction between the rotator cuff and one of the bones of the shoulder (acromion). Impingement syndrome is usually an overuse injury that causes swelling of the bursa (bursitis), swelling of the tendon (tendonitis), and/or a tear of the tendon (strain). Strains are classified into three categories. Grade 1 strains cause pain, but the tendon is not lengthened. Grade 2 strains include a lengthened ligament, due to the ligament being stretched or partially ruptured. With grade 2 strains there is still function, although the function may be decreased. Grade 3 strains include a complete tear of the tendon or muscle, and function is usually impaired. SYMPTOMS   Pain around the shoulder, often at the outer portion of the upper arm.  Pain that gets worse with shoulder function, especially when reaching overhead or lifting.  Sometimes, aching when not using the arm.  Pain that wakes you up at night.  Sometimes, tenderness, swelling, warmth, or redness over the affected area.  Loss of strength.  Limited motion of the shoulder, especially reaching behind the back (to the back pocket or to unhook bra) or across your body.  Crackling sound (crepitation) when moving the arm.  Biceps tendon pain and inflammation (in the front of the shoulder). Worse when bending the elbow or  lifting. CAUSES  Impingement syndrome is often an overuse injury, in which chronic (repetitive) motions cause the tendons or bursa to become inflamed. A strain occurs when a force is paced on the tendon or muscle that is greater than it can withstand. Common mechanisms of injury include: Stress from sudden increase in duration, frequency, or intensity of training.  Direct hit (trauma) to the shoulder.  Aging, erosion of the tendon with normal use.  Bony bump on shoulder (acromial spur). RISK INCREASES WITH:  Contact sports (football, wrestling, boxing).  Throwing sports (baseball, tennis, volleyball).  Weightlifting and bodybuilding.  Heavy labor.  Previous injury to the rotator cuff, including impingement.  Poor shoulder strength and flexibility.  Failure to warm up properly before activity.  Inadequate protective equipment.  Old age.  Bony bump on shoulder (acromial spur). PREVENTION   Warm up and stretch properly before activity.  Allow for adequate recovery between workouts.  Maintain physical fitness:  Strength, flexibility, and endurance.  Cardiovascular fitness.  Learn and use proper exercise technique. PROGNOSIS  If treated properly, impingement syndrome usually goes away within 6 weeks. Sometimes surgery is required.  RELATED COMPLICATIONS   Longer healing time if not properly treated, or if not given enough time to heal.  Recurring symptoms, that result in a chronic condition.  Shoulder stiffness, frozen shoulder, or loss of motion.  Rotator cuff tendon tear.  Recurring symptoms, especially if activity is resumed too soon, with overuse, with a direct blow, or when using poor technique. TREATMENT  Treatment first involves the use of ice and medicine, to reduce pain and inflammation.  The use of strengthening and stretching exercises may help reduce pain with activity. These exercises may be performed at home or with a therapist. If non-surgical  treatment is unsuccessful after more than 6 months, surgery may be advised. After surgery and rehabilitation, activity is usually possible in 3 months.  MEDICATION  If pain medicine is needed, nonsteroidal anti-inflammatory medicines (aspirin and ibuprofen), or other minor pain relievers (acetaminophen), are often advised.  Do not take pain medicine for 7 days before surgery.  Prescription pain relievers may be given, if your caregiver thinks they are needed. Use only as directed and only as much as you need.  Corticosteroid injections may be given by your caregiver. These injections should be reserved for the most serious cases, because they may only be given a certain number of times. HEAT AND COLD  Cold treatment (icing) should be applied for 10 to 15 minutes every 2 to 3 hours for inflammation and pain, and immediately after activity that aggravates your symptoms. Use ice packs or an ice massage.  Heat treatment may be used before performing stretching and strengthening activities prescribed by your caregiver, physical therapist, or athletic trainer. Use a heat pack or a warm water soak. SEEK MEDICAL CARE IF:   Symptoms get worse or do not improve in 4 to 6 weeks, despite treatment.  New, unexplained symptoms develop. (Drugs used in treatment may produce side effects.) EXERCISES  RANGE OF MOTION (ROM) AND STRETCHING EXERCISES - Impingement Syndrome (Rotator Cuff  Tendinitis, Bursitis) These exercises may help you when beginning to rehabilitate your injury. Your symptoms may go away with or without further involvement from your physician, physical therapist or athletic trainer. While completing these exercises, remember:   Restoring tissue flexibility helps normal motion to return to the joints. This allows healthier, less painful movement and activity.  An effective stretch should be held for at least 30 seconds.  A stretch should never be painful. You should only feel a gentle  lengthening or release in the stretched tissue. STRETCH - Flexion, Standing  Stand with good posture. With an underhand grip on your right / left hand, and an overhand grip on the opposite hand, grasp a broomstick or cane so that your hands are a little more than shoulder width apart.  Keeping your right / left elbow straight and shoulder muscles relaxed, push the stick with your opposite hand, to raise your right / left arm in front of your body and then overhead. Raise your arm until you feel a stretch in your right / left shoulder, but before you have increased shoulder pain.  Try to avoid shrugging your right / left shoulder as your arm rises, by keeping your shoulder blade tucked down and toward your mid-back spine. Hold for __________ seconds.  Slowly return to the starting position. Repeat __________ times. Complete this exercise __________ times per day. STRETCH - Abduction, Supine  Lie on your back. With an underhand grip on your right / left hand and an overhand grip on the opposite hand, grasp a broomstick or cane so that your hands are a little more than shoulder width apart.  Keeping your right / left elbow straight and your shoulder muscles relaxed, push the stick with your opposite hand, to raise your right / left arm out to the side of your body and then overhead. Raise your arm until you feel a stretch in your right / left shoulder, but before you have increased shoulder pain.  Try to avoid shrugging your  right / left shoulder as your arm rises, by keeping your shoulder blade tucked down and toward your mid-back spine. Hold for __________ seconds.  Slowly return to the starting position. Repeat __________ times. Complete this exercise __________ times per day. ROM - Flexion, Active-Assisted  Lie on your back. You may bend your knees for comfort.  Grasp a broomstick or cane so your hands are about shoulder width apart. Your right / left hand should grip the end of the stick,  so that your hand is positioned "thumbs-up," as if you were about to shake hands.  Using your healthy arm to lead, raise your right / left arm overhead, until you feel a gentle stretch in your shoulder. Hold for __________ seconds.  Use the stick to assist in returning your right / left arm to its starting position. Repeat __________ times. Complete this exercise __________ times per day.  ROM - Internal Rotation, Supine   Lie on your back on a firm surface. Place your right / left elbow about 60 degrees away from your side. Elevate your elbow with a folded towel, so that the elbow and shoulder are the same height.  Using a broomstick or cane and your strong arm, pull your right / left hand toward your body until you feel a gentle stretch, but no increase in your shoulder pain. Keep your shoulder and elbow in place throughout the exercise.  Hold for __________ seconds. Slowly return to the starting position. Repeat __________ times. Complete this exercise __________ times per day. STRETCH - Internal Rotation  Place your right / left hand behind your back, palm up.  Throw a towel or belt over your opposite shoulder. Grasp the towel with your right / left hand.  While keeping an upright posture, gently pull up on the towel, until you feel a stretch in the front of your right / left shoulder.  Avoid shrugging your right / left shoulder as your arm rises, by keeping your shoulder blade tucked down and toward your mid-back spine.  Hold for __________ seconds. Release the stretch, by lowering your healthy hand. Repeat __________ times. Complete this exercise __________ times per day. ROM - Internal Rotation   Using an underhand grip, grasp a stick behind your back with both hands.  While standing upright with good posture, slide the stick up your back until you feel a mild stretch in the front of your shoulder.  Hold for __________ seconds. Slowly return to your starting position. Repeat  __________ times. Complete this exercise __________ times per day.  STRETCH - Posterior Shoulder Capsule   Stand or sit with good posture. Grasp your right / left elbow and draw it across your chest, keeping it at the same height as your shoulder.  Pull your elbow, so your upper arm comes in closer to your chest. Pull until you feel a gentle stretch in the back of your shoulder.  Hold for __________ seconds. Repeat __________ times. Complete this exercise __________ times per day. STRENGTHENING EXERCISES - Impingement Syndrome (Rotator Cuff Tendinitis, Bursitis) These exercises may help you when beginning to rehabilitate your injury. They may resolve your symptoms with or without further involvement from your physician, physical therapist or athletic trainer. While completing these exercises, remember:  Muscles can gain both the endurance and the strength needed for everyday activities through controlled exercises.  Complete these exercises as instructed by your physician, physical therapist or athletic trainer. Increase the resistance and repetitions only as guided.  You may experience muscle  soreness or fatigue, but the pain or discomfort you are trying to eliminate should never worsen during these exercises. If this pain does get worse, stop and make sure you are following the directions exactly. If the pain is still present after adjustments, discontinue the exercise until you can discuss the trouble with your clinician.  During your recovery, avoid activity or exercises which involve actions that place your injured hand or elbow above your head or behind your back or head. These positions stress the tissues which you are trying to heal. STRENGTH - Scapular Depression and Adduction   With good posture, sit on a firm chair. Support your arms in front of you, with pillows, arm rests, or on a table top. Have your elbows in line with the sides of your body.  Gently draw your shoulder blades  down and toward your mid-back spine. Gradually increase the tension, without tensing the muscles along the top of your shoulders and the back of your neck.  Hold for __________ seconds. Slowly release the tension and relax your muscles completely before starting the next repetition.  After you have practiced this exercise, remove the arm support and complete the exercise in standing as well as sitting position. Repeat __________ times. Complete this exercise __________ times per day.  STRENGTH - Shoulder Abductors, Isometric  With good posture, stand or sit about 4-6 inches from a wall, with your right / left side facing the wall.  Bend your right / left elbow. Gently press your right / left elbow into the wall. Increase the pressure gradually, until you are pressing as hard as you can, without shrugging your shoulder or increasing any shoulder discomfort.  Hold for __________ seconds.  Release the tension slowly. Relax your shoulder muscles completely before you begin the next repetition. Repeat __________ times. Complete this exercise __________ times per day.  STRENGTH - External Rotators, Isometric  Keep your right / left elbow at your side and bend it 90 degrees.  Step into a door frame so that the outside of your right / left wrist can press against the door frame without your upper arm leaving your side.  Gently press your right / left wrist into the door frame, as if you were trying to swing the back of your hand away from your stomach. Gradually increase the tension, until you are pressing as hard as you can, without shrugging your shoulder or increasing any shoulder discomfort.  Hold for __________ seconds.  Release the tension slowly. Relax your shoulder muscles completely before you begin the next repetition. Repeat __________ times. Complete this exercise __________ times per day.  STRENGTH - Supraspinatus   Stand or sit with good posture. Grasp a __________ weight, or an  exercise band or tubing, so that your hand is "thumbs-up," like you are shaking hands.  Slowly lift your right / left arm in a "V" away from your thigh, diagonally into the space between your side and straight ahead. Lift your hand to shoulder height or as far as you can, without increasing any shoulder pain. At first, many people do not lift their hands above shoulder height.  Avoid shrugging your right / left shoulder as your arm rises, by keeping your shoulder blade tucked down and toward your mid-back spine.  Hold for __________ seconds. Control the descent of your hand, as you slowly return to your starting position. Repeat __________ times. Complete this exercise __________ times per day.  STRENGTH - External Rotators  Secure a rubber exercise   band or tubing to a fixed object (table, pole) so that it is at the same height as your right / left elbow when you are standing or sitting on a firm surface.  Stand or sit so that the secured exercise band is at your uninjured side.  Bend your right / left elbow 90 degrees. Place a folded towel or small pillow under your right / left arm, so that your elbow is a few inches away from your side.  Keeping the tension on the exercise band, pull it away from your body, as if pivoting on your elbow. Be sure to keep your body steady, so that the movement is coming only from your rotating shoulder.  Hold for __________ seconds. Release the tension in a controlled manner, as you return to the starting position. Repeat __________ times. Complete this exercise __________ times per day.  STRENGTH - Internal Rotators   Secure a rubber exercise band or tubing to a fixed object (table, pole) so that it is at the same height as your right / left elbow when you are standing or sitting on a firm surface.  Stand or sit so that the secured exercise band is at your right / left side.  Bend your elbow 90 degrees. Place a folded towel or small pillow under your right  / left arm so that your elbow is a few inches away from your side.  Keeping the tension on the exercise band, pull it across your body, toward your stomach. Be sure to keep your body steady, so that the movement is coming only from your rotating shoulder.  Hold for __________ seconds. Release the tension in a controlled manner, as you return to the starting position. Repeat __________ times. Complete this exercise __________ times per day.  STRENGTH - Scapular Protractors, Standing   Stand arms length away from a wall. Place your hands on the wall, keeping your elbows straight.  Begin by dropping your shoulder blades down and toward your mid-back spine.  To strengthen your protractors, keep your shoulder blades down, but slide them forward on your rib cage. It will feel as if you are lifting the back of your rib cage away from the wall. This is a subtle motion and can be challenging to complete. Ask your caregiver for further instruction, if you are not sure you are doing the exercise correctly.  Hold for __________ seconds. Slowly return to the starting position, resting the muscles completely before starting the next repetition. Repeat __________ times. Complete this exercise __________ times per day. STRENGTH - Scapular Protractors, Supine  Lie on your back on a firm surface. Extend your right / left arm straight into the air while holding a __________ weight in your hand.  Keeping your head and back in place, lift your shoulder off the floor.  Hold for __________ seconds. Slowly return to the starting position, and allow your muscles to relax completely before starting the next repetition. Repeat __________ times. Complete this exercise __________ times per day. STRENGTH - Scapular Protractors, Quadruped  Get onto your hands and knees, with your shoulders directly over your hands (or as close as you can be, comfortably).  Keeping your elbows locked, lift the back of your rib cage up  into your shoulder blades, so your mid-back rounds out. Keep your neck muscles relaxed.  Hold this position for __________ seconds. Slowly return to the starting position and allow your muscles to relax completely before starting the next repetition. Repeat __________ times. Complete  this exercise __________ times per day.  STRENGTH - Scapular Retractors  Secure a rubber exercise band or tubing to a fixed object (table, pole), so that it is at the height of your shoulders when you are either standing, or sitting on a firm armless chair.  With a palm down grip, grasp an end of the band in each hand. Straighten your elbows and lift your hands straight in front of you, at shoulder height. Step back, away from the secured end of the band, until it becomes tense.  Squeezing your shoulder blades together, draw your elbows back toward your sides, as you bend them. Keep your upper arms lifted away from your body throughout the exercise.  Hold for __________ seconds. Slowly ease the tension on the band, as you reverse the directions and return to the starting position. Repeat __________ times. Complete this exercise __________ times per day. STRENGTH - Shoulder Extensors   Secure a rubber exercise band or tubing to a fixed object (table, pole) so that it is at the height of your shoulders when you are either standing, or sitting on a firm armless chair.  With a thumbs-up grip, grasp an end of the band in each hand. Straighten your elbows and lift your hands straight in front of you, at shoulder height. Step back, away from the secured end of the band, until it becomes tense.  Squeezing your shoulder blades together, pull your hands down to the sides of your thighs. Do not allow your hands to go behind you.  Hold for __________ seconds. Slowly ease the tension on the band, as you reverse the directions and return to the starting position. Repeat __________ times. Complete this exercise __________ times  per day.  STRENGTH - Scapular Retractors and External Rotators   Secure a rubber exercise band or tubing to a fixed object (table, pole) so that it is at the height as your shoulders, when you are either standing, or sitting on a firm armless chair.  With a palm down grip, grasp an end of the band in each hand. Bend your elbows 90 degrees and lift your elbows to shoulder height, at your sides. Step back, away from the secured end of the band, until it becomes tense.  Squeezing your shoulder blades together, rotate your shoulders so that your upper arms and elbows remain stationary, but your fists travel upward to head height.  Hold for __________ seconds. Slowly ease the tension on the band, as you reverse the directions and return to the starting position. Repeat __________ times. Complete this exercise __________ times per day.  STRENGTH - Scapular Retractors and External Rotators, Rowing   Secure a rubber exercise band or tubing to a fixed object (table, pole) so that it is at the height of your shoulders, when you are either standing, or sitting on a firm armless chair.  With a palm down grip, grasp an end of the band in each hand. Straighten your elbows and lift your hands straight in front of you, at shoulder height. Step back, away from the secured end of the band, until it becomes tense.  Step 1: Squeeze your shoulder blades together. Bending your elbows, draw your hands to your chest, as if you are rowing a boat. At the end of this motion, your hands and elbow should be at shoulder height and your elbows should be out to your sides.  Step 2: Rotate your shoulders, to raise your hands above your head. Your forearms should be vertical   and your upper arms should be horizontal.  Hold for __________ seconds. Slowly ease the tension on the band, as you reverse the directions and return to the starting position. Repeat __________ times. Complete this exercise __________ times per day.   STRENGTH - Scapular Depressors  Find a sturdy chair without wheels, such as a dining room chair.  Keeping your feet on the floor, and your hands on the chair arms, lift your bottom up from the seat, and lock your elbows.  Keeping your elbows straight, allow gravity to pull your body weight down. Your shoulders will rise toward your ears.  Raise your body against gravity by drawing your shoulder blades down your back, shortening the distance between your shoulders and ears. Although your feet should always maintain contact with the floor, your feet should progressively support less body weight, as you get stronger.  Hold for __________ seconds. In a controlled and slow manner, lower your body weight to begin the next repetition. Repeat __________ times. Complete this exercise __________ times per day.    This information is not intended to replace advice given to you by your health care provider. Make sure you discuss any questions you have with your health care provider.   Document Released: 12/24/2004 Document Revised: 01/14/2014 Document Reviewed: 04/07/2008 [employer] Interactive Patient Education [employer]
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
## 2015-10-11 ENCOUNTER — Ambulatory Visit (INDEPENDENT_AMBULATORY_CARE_PROVIDER_SITE_OTHER): Payer: BLUE CROSS/BLUE SHIELD | Admitting: Family Medicine

## 2015-10-11 ENCOUNTER — Encounter: Payer: Self-pay | Admitting: Family Medicine

## 2015-10-11 VITALS — BP 119/59 | HR 73 | Ht 62.0 in | Wt 152.0 lb

## 2015-10-11 DIAGNOSIS — M542 Cervicalgia: Secondary | ICD-10-CM | POA: Diagnosis not present

## 2015-10-11 DIAGNOSIS — H9201 Otalgia, right ear: Secondary | ICD-10-CM | POA: Diagnosis not present

## 2015-10-11 MED ORDER — ACETIC ACID 2 % OT SOLN: 4.0000 [drp] | Freq: Three times a day (TID) | OTIC | 0 refills | Status: DC

## 2015-10-11 NOTE — Progress Notes (Signed)
Subjective:    CC: Right ear pain  HPI: R ear x 3 wks denies fever or drainage.   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 She has been using colloidal silver drops in her ear.  She says started with a URI about 3 weeks ago and had a lot of head congestion, etc. She says most of the pain is actually behind her ear right at the occiput. She says it's even sore sometimes to turn her head a certain way she'll feel the pain. She denies any injury to her neck or spine.   Past medical history, Surgical history, Family history not pertinant except as noted below, Social history, Allergies, and medications have been entered into the medical record, reviewed, and corrections made.   Review of Systems: No fevers, chills, night sweats, weight loss, chest pain, or shortness of breath.   Objective:    General: Well Developed, well nourished, and in no acute distress.  Neuro: Alert and oriented x3, extra-ocular muscles intact, sensation grossly intact.  HEENT: Normocephalic, atraumatic, Oropharynx is clear, TMs and canals are clear bilaterally. No significant cervical lymphadenopathy. No thyromegaly.  Skin: Warm and dry, no rashes. Cardiac: Regular rate and rhythm, no murmurs rubs or gallops, no lower extremity edema.  Respiratory: Clear to auscultation bilaterally. Not using accessory muscles, speaking in full sentences. MSK: Normal cervical flexion and extension. That she did have pain on the right side of her neck with rotation to the left and some discomfort with rotation to the right as well.   Impression and Recommendations:    Right ear pain/otalgia - ear exam is normal.  She is in the water a lot so consider early OE. Will tx with acetic acid drops.    Cervical pain-I really feel like most of her pain is coming from her neck and not really from her ear since her ear exam is clear. Given a handout with some stretches to do on her own at home and should her to additional  stretches to work on to loosen up the facet joints. Recommend anti-inflammatory such as ibuprofen 400 mg 3 times a day. Stop immediately if any GI upset or irritation. If not significantly better in one week and please let me know.

## 2016-10-30 ENCOUNTER — Ambulatory Visit (INDEPENDENT_AMBULATORY_CARE_PROVIDER_SITE_OTHER): Payer: 59 | Admitting: Family Medicine

## 2016-10-30 ENCOUNTER — Encounter: Payer: Self-pay | Admitting: Family Medicine

## 2016-10-30 VITALS — BP 114/64 | HR 90 | Ht 62.0 in | Wt 159.0 lb

## 2016-10-30 DIAGNOSIS — M25562 Pain in left knee: Secondary | ICD-10-CM

## 2016-10-30 DIAGNOSIS — M7661 Achilles tendinitis, right leg: Secondary | ICD-10-CM

## 2016-10-30 DIAGNOSIS — L989 Disorder of the skin and subcutaneous tissue, unspecified: Secondary | ICD-10-CM | POA: Diagnosis not present

## 2016-10-30 DIAGNOSIS — H9201 Otalgia, right ear: Secondary | ICD-10-CM

## 2016-10-30 DIAGNOSIS — M25551 Pain in right hip: Secondary | ICD-10-CM | POA: Diagnosis not present
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
## 2016-10-30 NOTE — Patient Instructions (Addendum)
Can follow up for more definitive treatment of the skin lesion on your inner thigh if desired.  Just schedule at your convenience. Encourage you to try the exercises for the Achilles tendon.  If not improving over the next 3 weeks then encourage you to follow-up with 1 of our sports medicine providers.

## 2017-01-20 ENCOUNTER — Ambulatory Visit (INDEPENDENT_AMBULATORY_CARE_PROVIDER_SITE_OTHER): Payer: 59 | Admitting: Sports Medicine

## 2017-01-20 ENCOUNTER — Encounter: Payer: Self-pay | Admitting: Sports Medicine

## 2017-01-20 DIAGNOSIS — M25511 Pain in right shoulder: Secondary | ICD-10-CM | POA: Diagnosis not present

## 2017-01-20 DIAGNOSIS — G8929 Other chronic pain: Secondary | ICD-10-CM

## 2017-01-20 DIAGNOSIS — M160 Bilateral primary osteoarthritis of hip: Secondary | ICD-10-CM | POA: Diagnosis not present

## 2017-01-20 MED ORDER — CELECOXIB 200 MG PO CAPS: ORAL_CAPSULE | ORAL | 2 refills | Status: DC

## 2017-01-20 NOTE — Assessment & Plan Note (Signed)
Did well with conservative treatment initially, restarting physical therapy with dry needling. Adding Celebrex, she can do turmeric, Devil's Claw, glucosamine and chondroitin. Return in 1 month, we will consider injections if no better, she did see Dr. Caswell CorwinStubbs who had recommended hip arthroplasty.

## 2017-01-20 NOTE — Assessment & Plan Note (Signed)
Clinically represents bicipital tendinitis, I would like physical therapy to work on her shoulders as well, Celebrex should help. MRI at the return visit if no better.
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
## 2017-01-29 ENCOUNTER — Ambulatory Visit (INDEPENDENT_AMBULATORY_CARE_PROVIDER_SITE_OTHER): Payer: 59 | Admitting: Rehabilitative and Restorative Service Providers"

## 2017-01-29 ENCOUNTER — Encounter: Payer: Self-pay | Admitting: Rehabilitative and Restorative Service Providers"

## 2017-01-29 DIAGNOSIS — M25551 Pain in right hip: Secondary | ICD-10-CM

## 2017-01-29 DIAGNOSIS — R29898 Other symptoms and signs involving the musculoskeletal system: Secondary | ICD-10-CM

## 2017-01-29 DIAGNOSIS — M25552 Pain in left hip: Secondary | ICD-10-CM | POA: Diagnosis not present

## 2017-01-29 DIAGNOSIS — M79601 Pain in right arm: Secondary | ICD-10-CM

## 2017-01-29 NOTE — Patient Instructions (Signed)
HIP: Hamstrings - Supine  Place strap around foot. Raise leg up, keeping knee straight.  Bend opposite knee to protect back if indicated. Hold 30 seconds. 3 reps per set, 2-3 sets per day  Outer Hip Stretch: Reclined IT Band Stretch (Strap)   Strap around one foot, pull leg across body until you feel a pull or stretch in the outside of your hip, with shoulders on mat. Hold for 30 seconds. Repeat 3 times each leg. 2-3 times/day.   Quads / HF, Supine   Lie near edge of bed, pull both knees up toward chest. Hold one knee as you drop the other leg off the edge of the bed.  Relax hanging knee/can bend knee back if indicated. Hold 30 seconds. Repeat 3 times per session. Do 2-3 sessions per day.  Quads / HF, Prone KNEE: Quadriceps - Prone    Place strap around ankle. Bring ankle toward buttocks. Press hip into surface. Hold 30 seconds. Repeat 3 times per session. Do 2-3 sessions per day.   Self massage with ~3 inch plastic ball    ELBOW: Biceps - Standing    Standing in doorway, place one hand on wall, elbow straight. Lean forward. Hold _30__ seconds. __3_ reps per set, _2-3__ sets per day   TENS UNIT: This is helpful for muscle pain and spasm.   Search and Purchase a TENS 7000 2nd edition at www.tenspros.com. It should be less than $30.     TENS unit instructions: Do not shower or bathe with the unit on Turn the unit off before removing electrodes or batteries If the electrodes lose stickiness add a drop of water to the electrodes after they are disconnected from the unit and place on plastic sheet. If you continued to have difficulty, call the TENS unit company to purchase more electrodes. Do not apply lotion on the skin area prior to use. Make sure the skin is clean and dry as this will help prolong the life of the electrodes. After use, always check skin for unusual red areas, rash or other skin difficulties. If there are any skin problems, does not apply electrodes to  the same area. Never remove the electrodes from the unit by pulling the wires. Do not use the TENS unit or electrodes other than as directed. Do not change electrode placement without consultating your therapist or physician. Keep 2 fingers with between each electrode.

## 2017-01-29 NOTE — Therapy (Addendum)
[facility] [place] [address] [place], [place], [address] Phone: [phone]   Fax:  [phone]  Physical Therapy Evaluation  Patient Details  Name: Charlena Haub MRN: [id number] Date of Birth: 1970-11-06 Referring Provider: Dr Benjamin Stain   Encounter Date: 01/29/2017  PT End of Session - 01/29/17 0800    Visit Number  1    Number of Visits  12    Date for PT Re-Evaluation  03/12/17    PT Start Time  0800    PT Stop Time  0901    PT Time Calculation (min)  61 min    Activity Tolerance  Patient tolerated treatment well       Past Medical History:  Diagnosis Date  . Osteoarthritis of right hip 10/19/2012  . Postpartum depression     Past Surgical History:  Procedure Laterality Date  . LEEP    . Right TM repair    . thumb and middle finger surgery     overuse injury    There were no vitals filed for this visit.   Subjective Assessment - 01/29/17 0807    Subjective  Patient reports that she hsa inner thigh pain and tightness with pain in both hips Rt > Lt with pain greater with her period. ROM is limited. Symptoms have been present for the past 7 years with no known injury. She noticed symptoms gradually worsening. some times are worse than others. She has pain in the Rt biceps with wt training. She has discomfort and soreness in biceps area which has been present in the past 2 months. 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     How long can you sit comfortably?  60 min     How long can you stand comfortably?  30 min     How long can you walk comfortably?  unknown     Diagnostic tests  xrays     Patient Stated Goals  increase flexibility and ROM; decrease pain in hips and Rt biceps     Currently in Pain?  Yes    Pain Score  2     Pain Location  Hip    Pain Orientation  Right;Left Rt > Lt     Pain Descriptors / Indicators   Discomfort    Pain Radiating Towards  inner thigh     Pain Onset  More than a month ago    Pain Frequency  Constant    Aggravating Factors   running; prolonged posture; squatting; yoga; menstral cycle    Pain Relieving Factors  dry needling; stretching; massage          [facility] PT Assessment - 01/29/17 0001      Assessment   Medical Diagnosis  Bilat hip pain; Rt biceps pain     Referring Provider  Dr Benjamin Stain    Onset Date/Surgical Date  01/07/10 7 years for hips; 12/07/16 for Rt biceps     Hand Dominance  Right    Next MD Visit  follow up not yet scehduled     Prior Therapy  yes for hips ~ 2 yrs ago       Precautions   Precautions  None      Restrictions   Weight Bearing Restrictions  No      Balance Screen   Has the patient fallen in the past 6 months  Yes    How  many times?  1 no injury     Has the patient had a decrease in activity level because of a fear of falling?   No    Is the patient reluctant to leave their home because of a fear of falling?   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     Leisure  gym/home gym 3-5 x/wk cycling; kick boxing; wt lifting - 45 min class w/ free wts; yoga 1x/wk; household chores; home school mom      Observation/Other Assessments   Focus on Therapeutic Outcomes (FOTO)   38% limitation       Sensation   Additional Comments  WNL's bilat UE; radiation pain Rt UE with certain movements such as opening a door turning the knob       Posture/Postural Control   Posture Comments  head forward shoulders rounded and elevated; head of the humerus anterior; scapulae abducted; flexed fwd at hips; knees hyperextended       AROM   Overall AROM Comments  end range tightness  through spine and bilat hips Rt > Lt hip     Right/Left Hip  -- end range tightness bilat hips Rt > Lt     Lumbar Flexion  80%    Lumbar Extension  30%    Lumbar - Right Side Bend  75%    Lumbar - Left Side Bend  75%    Lumbar - Right Rotation  30%    Lumbar - Left Rotation  30%      Strength   Overall Strength Comments  5/5 bilat LE's       Flexibility   Soft Tissue Assessment /Muscle Length  -- hip adductors and psoas tight Rt > Lt     Hamstrings  tight bilat ~ 70 deg Rt 75 deg Lt     Quadriceps  tight bilat     ITB  tight bilat     Piriformis  tight bilat       Palpation   Spinal mobility  hypomobile lumbar spine with CPA and lateral mobs     SI assessment   equal pelvic alignment in standing     Palpation comment  significant tightness in Rt > Lt psoas; hip adductors; piriformis; gluts              Objective measurements completed on examination: See above findings.      [facility] Adult PT Treatment/Exercise - 01/29/17 0001      Self-Care   Self-Care  -- education re importance of stretching       Therapeutic Activites    Therapeutic Activities  -- myofacial ball release work bilat hips/LE's      Neuro Re-ed    Neuro Re-ed Details   working on posture and alignment - avoid standin with knees hyperextended       Knee/Hip Exercises: Stretches   Passive Hamstring Stretch  2 reps;30 seconds supine with strap     Quad Stretch  2 reps;30 seconds prone with strap     Hip Flexor Stretch  2 reps;30 seconds supine bilat knees to chest - then lower one LE off table  Hip Flexor Stretch Limitations  sititng in chair with one LE off edge of chair 30-45 sec x 2 reps each side     ITB Stretch  1 rep;30 seconds supine with strap     Other Knee/Hip Stretches  hip adductor stretch in supine with LE supported with strap LE out to side 30 sec x 2 each LE              PT Education - 01/29/17 0845    Education provided  Yes    Education Details  HEP           PT  Long Term Goals - 01/29/17 1202      PT LONG TERM GOAL #1   Title  Improve posture and alignment with patient to demonstrate good upright posture with hips extended and knees out of hyperextension. 03/12/17    Time  6    Period  Weeks    Status  New      PT LONG TERM GOAL #2   Title  Improve joint mobility and soft tissue extensibility in bilat LE's and trunk with patient demonstrating good joint ROM bilat hips 03/12/17    Time  6    Period  Weeks    Status  New      PT LONG TERM GOAL #3   Title  Patient reports 50-70% decrease in hip/LE pain 03/12/17    Time  6    Period  Weeks    Status  New      PT LONG TERM GOAL #4   Title  Independent in HEP with focus on appropriate stretching 03/12/17    Time  6    Period  Weeks    Status  New      PT LONG TERM GOAL #5   Title  Improve FOTO to </= 30% limitation 03/12/17    Time  6    Period  Weeks    Status  New             Plan - 01/29/17 1151    Clinical Impression Statement  Dorinda Hillarsha presents with history of bilat hip pain for the past 7 years with symptoms increasing in the past 6-12 months. She has responded well to PT including dry needling with good decreased in symptoms. She presents with poor posture and alignment; limited hip and trunk mobility and ROM; muscular tightness to palpation through the hip and lumbar musculature; decreased activity tolerance due to muscular and joint pain. Patient will benefit from PT to address problems identifited.     Clinical Presentation  Stable    Clinical Decision Making  Low    Rehab Potential  Good    PT Frequency  2x / week    PT Duration  6 weeks    PT Treatment/Interventions  Patient/family education;ADLs/Self Care Home Management;Cryotherapy;Electrical Stimulation;Iontophoresis 4mg /ml Dexamethasone;Moist Heat;Ultrasound;Dry needling;Manual techniques;Neuromuscular re-education;Therapeutic activities;Therapeutic exercise    PT Next Visit Plan  review HEP; progress with stretches LE's and  trunk; manual work vs DN hip flexors/adductors/lumbar musculature; education; modalities as indicated      Consulted and Agree with Plan of Care  Patient       Patient will benefit from skilled therapeutic intervention in order to improve the following deficits and impairments:  Postural dysfunction, Improper body mechanics, Pain, Increased muscle spasms, Impaired perceived functional ability, Hypomobility, Decreased mobility, Decreased range of motion, Decreased activity tolerance  Visit Diagnosis: Pain in right hip - Plan: PT plan of care cert/re-cert  Pain in left hip - Plan: PT plan of care cert/re-cert  Other symptoms and signs involving the musculoskeletal system - Plan: PT plan of care cert/re-cert  Pain of right upper extremity - Plan: PT plan of care cert/re-cert     Problem List Patient Active Problem List   Diagnosis Date Noted  . Primary osteoarthritis of both hips 01/20/2017  . Chronic right shoulder pain 01/20/2017  . Cough 08/19/2014  . Pertussis exposure 08/19/2014  . 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 ALLERGIC RHINITIS 05/29/2009  . BACK PAIN, THORACIC REGION 03/15/2009  . FOOT PAIN, LEFT 01/18/2009  . IRON DEFICIENCY 11/16/2007  . DIZZINESS 11/16/2007    Sendy Pluta Rober Minion PT, MPH  01/29/2017, 5:04 PM  [facility] [address] [place], [place], [address] Phone: [phone]   Fax:  [phone]  Name: Vena Bassinger MRN: [id number] Date of Birth: 08/05/1970

## 2017-02-05 ENCOUNTER — Ambulatory Visit (INDEPENDENT_AMBULATORY_CARE_PROVIDER_SITE_OTHER): Payer: 59 | Admitting: Physical Therapy

## 2017-02-05 DIAGNOSIS — R29898 Other symptoms and signs involving the musculoskeletal system: Secondary | ICD-10-CM | POA: Diagnosis not present

## 2017-02-05 DIAGNOSIS — M79601 Pain in right arm: Secondary | ICD-10-CM

## 2017-02-05 DIAGNOSIS — M25552 Pain in left hip: Secondary | ICD-10-CM | POA: Diagnosis not present

## 2017-02-05 DIAGNOSIS — M25551 Pain in right hip: Secondary | ICD-10-CM

## 2017-02-05 NOTE — Patient Instructions (Signed)
Scapula Adduction With Pectorals, Low   Stand in doorframe with palms against frame and arms at 45. Lean forward and squeeze shoulder blades. Hold _30__ seconds. Repeat _2__ times per session. Do __2_ sessions per day.  Copyright  VHI. All rights reserved.    Scapula Adduction With Pectorals, Mid-Range   Stand in doorframe with palms against frame and arms at 90. Lean forward and squeeze shoulder blades. Hold _30__ seconds. Repeat _2__ times per session. Do _2__ sessions per day. \Scapula Adduction With Pectorals, High   Stand in doorframe with palms against frame and arms at 120. Lean forward and squeeze shoulder blades. Hold _15-30__ seconds. Repeat _2__ times per session. Do _2_ sessions per day.  Resisted External Rotation: in Neutral - Bilateral  PALMS UP!!! Sit or stand, tubing in both hands, elbows at sides, bent to 90, forearms forward. Pinch shoulder blades together and rotate forearms out. Keep elbows at sides. Repeat __10__ times per set. Do __2-3__ sets per session. Do __3-4__ sessions per week.  Resistive Band Rowing   With resistive band anchored in door, grasp both ends. Keeping elbows bent, pull back, squeezing shoulder blades together. Hold _3-5___ seconds. Repeat _10-30___ times. Do __1__ sessions per day.   Strengthening: Resisted Extension   Hold tubing with both hands, arms forward. Pull arms back, elbow straight. Repeat _10-30___ times per set. Do ____ sets per session. Do _1___ sessions per day.

## 2017-02-05 NOTE — Therapy (Signed)
[place], [place], [address] Phone: [phone]   Fax:  [phone]  Physical Therapy Treatment  Patient Details  Name: Julia Griffith MRN: [id number] Date of Birth: 03/20/1970 Referring Provider: Dr. Dianah Field   Encounter Date: 02/05/2017  PT End of Session - 02/05/17 0936    Visit Number  2    Number of Visits  12    Date for PT Re-Evaluation  03/12/17    PT Start Time  0849    PT Stop Time  0930    PT Time Calculation (min)  41 min    Activity Tolerance  Patient tolerated treatment well    Behavior During Therapy  [facility] for tasks assessed/performed       Past Medical History:  Diagnosis Date  . Osteoarthritis of right hip 10/19/2012  . Postpartum depression     Past Surgical History:  Procedure Laterality Date  . LEEP    . Right TM repair    . thumb and middle finger surgery     overuse injury    There were no vitals filed for this visit.  Subjective Assessment - 02/05/17 0853    Subjective  Pt reports her Rt hip was bothering her bothering her yesterday; today not as bad. She has not lifted weights since last week, however she has done push ups (25 total in session).      Patient Stated Goals  increase flexibility and ROM; decrease pain in hips and Rt biceps     Currently in Pain?  No/denies    Pain Score  0-No pain    Aggravating Factors   running; prolonged postures; squatting    Pain Relieving Factors  dry needling, stretching, yoga, massage         [facility] PT Assessment - 02/05/17 0001      Assessment   Medical Diagnosis  Bilat hip pain; Rt biceps pain     Referring Provider  Dr. Dianah Field    Onset Date/Surgical Date  01/07/10 7 years for hips; 12/07/16 for Rt biceps     Hand Dominance  Right    Next MD Visit  follow up not yet scehduled         [facility] Adult PT Treatment/Exercise - 02/05/17 0001      Self-Care   Self-Care  Other Self-Care Comments    Other  Self-Care Comments   Pt educated on self massage to pecs with ball.  Pt returned demo      Knee/Hip Exercises: Stretches   Passive Hamstring Stretch  Right;Left;2 reps;30 seconds    Quad Stretch  Right;Left;2 reps;20 seconds standing, preferred by pt.     ITB Stretch  Right;3 reps;20 seconds;Limitations    ITB Stretch Limitations  trial of three positions including supine PTA assist.  Groin spasm each rep.     Other Knee/Hip Stretches  Hip adductor stretch in standing lunge x 30 sec x 2 each leg.   unable to tolerate supine with strap    Other Knee/Hip Stretches  butterfly x 30 sec bilat      Knee/Hip Exercises: Aerobic   Nustep  L5: arms/legs 6 min  PTA present to discuss progress      Shoulder Exercises: Standing   External Rotation  Strengthening;Both;10 reps;Theraband 3 sec hold, eccentric return    Theraband Level (Shoulder External Rotation)  Level 2 (Red)      Shoulder Exercises: Stretch   Other Shoulder Stretches  3 way doorway stretch bilat  x 30 sec x 3 reps each .     Other Shoulder Stretches  shoulder ext stretch holding table x 30 sec RUE - then hands laced behind back (strap between hands) x 3 reps of 15 sec       Modalities   Modalities  -- pt declined.              PT Education - 02/05/17 0930    Education provided  Yes    Education Details  HEP    Person(s) Educated  Patient    Methods  Explanation;Demonstration;Handout    Comprehension  Verbalized understanding;Returned demonstration          PT Long Term Goals - 01/29/17 1202      PT LONG TERM GOAL #1   Title  Improve posture and alignment with patient to demonstrate good upright posture with hips extended and knees out of hyperextension. 03/12/17    Time  6    Period  Weeks    Status  New      PT LONG TERM GOAL #2   Title  Improve joint mobility and soft tissue extensibility in bilat LE's and trunk with patient demonstrating good joint ROM bilat hips 03/12/17    Time  6    Period  Weeks    Status   New      PT LONG TERM GOAL #3   Title  Patient reports 50-70% decrease in hip/LE pain 03/12/17    Time  6    Period  Weeks    Status  New      PT LONG TERM GOAL #4   Title  Independent in HEP with focus on appropriate stretching 03/12/17    Time  6    Period  Weeks    Status  New      PT LONG TERM GOAL #5   Title  Improve FOTO to </= 30% limitation 03/12/17    Time  6    Period  Weeks    Status  New            Plan - 02/05/17 1233    Clinical Impression Statement  Pt presents with continued tightness in bilat hip and Rt bicep. Pt educated regarding importance of stretching and holding off on strength training until symptoms improved.  Pt had spasming of Rt groin with attempts of Rt ITB stretch.  Limited tolerance for both ITB and adductor stretches (many positions trialed).  No goals met yet; only 2nd visit.     Rehab Potential  Good    PT Frequency  2x / week    PT Duration  6 weeks    PT Treatment/Interventions  Patient/family education;ADLs/Self Care Home Management;Cryotherapy;Electrical Stimulation;Iontophoresis 14m/ml Dexamethasone;Moist Heat;Ultrasound;Dry needling;Manual techniques;Neuromuscular re-education;Therapeutic activities;Therapeutic exercise    PT Next Visit Plan  manual therapy and DN to adductors/ hip musculature along with Rt pec/bicep. Continued education of postural strengthening / stretching.     Consulted and Agree with Plan of Care  Patient       Patient will benefit from skilled therapeutic intervention in order to improve the following deficits and impairments:  Postural dysfunction, Improper body mechanics, Pain, Increased muscle spasms, Impaired perceived functional ability, Hypomobility, Decreased mobility, Decreased range of motion, Decreased activity tolerance  Visit Diagnosis: Pain in right hip  Pain in left hip  Pain of right upper extremity  Other symptoms and signs involving the musculoskeletal system     Problem List Patient Active  Problem List  Diagnosis Date Noted  . Primary osteoarthritis of both hips 01/20/2017  . Chronic right shoulder pain 01/20/2017  . Cough 08/19/2014  . Pertussis exposure 08/19/2014  . 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 ALLERGIC RHINITIS 05/29/2009  . BACK PAIN, THORACIC REGION 03/15/2009  . FOOT PAIN, LEFT 01/18/2009  . IRON DEFICIENCY 11/16/2007  . DIZZINESS 11/16/2007   Kerin Perna, PTA 02/05/17 12:41 PM  [place], [place], [address] Phone: [phone]   Fax:  [phone]  Name: Julia Griffith MRN: [id number] Date of Birth: 03-13-1970

## 2017-02-07 ENCOUNTER — Encounter: Payer: Self-pay | Admitting: Rehabilitative and Restorative Service Providers"

## 2017-02-07 ENCOUNTER — Ambulatory Visit (INDEPENDENT_AMBULATORY_CARE_PROVIDER_SITE_OTHER): Payer: 59 | Admitting: Rehabilitative and Restorative Service Providers"

## 2017-02-07 DIAGNOSIS — R29898 Other symptoms and signs involving the musculoskeletal system: Secondary | ICD-10-CM

## 2017-02-07 DIAGNOSIS — M79601 Pain in right arm: Secondary | ICD-10-CM

## 2017-02-07 DIAGNOSIS — M25552 Pain in left hip: Secondary | ICD-10-CM | POA: Diagnosis not present

## 2017-02-07 DIAGNOSIS — M25551 Pain in right hip: Secondary | ICD-10-CM

## 2017-02-07 NOTE — Patient Instructions (Signed)

## 2017-02-07 NOTE — Therapy (Signed)
[facility] [place] [address] [place], [place], [address] Phone: [phone]   Fax:  [phone]  Physical Therapy Treatment  Patient Details  Name: Julia Griffith MRN: [id number] Date of Birth: Oct 16, 1970 Referring Provider: Dr. Benjamin Stainhekkekandam   Encounter Date: 02/07/2017  PT End of Session - 02/07/17 0848    Visit Number  3    Number of Visits  12    Date for PT Re-Evaluation  03/12/17    PT Start Time  0848    PT Stop Time  0946    PT Time Calculation (min)  58 min    Activity Tolerance  Patient tolerated treatment well       Past Medical History:  Diagnosis Date  . Osteoarthritis of right hip 10/19/2012  . Postpartum depression     Past Surgical History:  Procedure Laterality Date  . LEEP    . Right TM repair    . thumb and middle finger surgery     overuse injury    There were no vitals filed for this visit.  Subjective Assessment - 02/07/17 0850    Subjective  Patient reports that she is about the same. She has pain in the Rt hip today from yoga last night. She has done less weight training but plans to do some for lower body with light weights. Will avoid biceps curls.     Currently in Pain?  Yes    Pain Score  3     Pain Location  Hip    Pain Orientation  Right    Pain Descriptors / Indicators  Nagging    Pain Type  Chronic pain    Pain Radiating Towards  inner thigh     Pain Onset  More than a month ago    Pain Frequency  Intermittent                      [facility] Adult PT Treatment/Exercise - 02/07/17 0001      Knee/Hip Exercises: Stretches   Passive Hamstring Stretch  Right;Left;2 reps;30 seconds    Quad Stretch  Right;Left;2 reps;20 seconds prone with strap     Hip Flexor Stretch  2 reps;30 seconds both knees to chest dropping Rt LE off table     Other Knee/Hip Stretches  hip adductor stretch supine with strap 30 sec x 2     Other Knee/Hip Stretches  butterfly x 30 sec bilat      Moist Heat Therapy   Number Minutes Moist Heat  20 Minutes    Moist Heat Location  Hip anterior; medial thigh/quads      Electrical Stimulation   Electrical Stimulation Location  medial thigh - adductors; quads Rt LE     Electrical Stimulation Action  IFC    Electrical Stimulation Parameters  to tolerance    Electrical Stimulation Goals  Tone;Pain      Manual Therapy   Manual therapy comments  pt supine     Soft tissue mobilization  deep tissue work through the Rt hip and thigh in adductors; quads; hip flexors    Myofascial Release  anterior and medial Rt thigh        Trigger Point Dry Needling - 02/07/17 0931    Consent Given?  Yes    Education Handout Provided  Yes    Tensor Fascia Lata Response  Palpable increased muscle length    Quadriceps Response  Palpable increased muscle length    Adductor Response  Palpable increased muscle length           PT Education - 02/07/17 0853    Education provided  Yes    Education Details  HEP     Person(s) Educated  Patient    Methods  Explanation    Comprehension  Verbalized understanding          PT Long Term Goals - 02/07/17 0934      PT LONG TERM GOAL #1   Title  Improve posture and alignment with patient to demonstrate good upright posture with hips extended and knees out of hyperextension. 03/12/17    Time  6    Period  Weeks    Status  On-going      PT LONG TERM GOAL #2   Title  Improve joint mobility and soft tissue extensibility in bilat LE's and trunk with patient demonstrating good joint ROM bilat hips 03/12/17    Time  6    Period  Weeks    Status  On-going      PT LONG TERM GOAL #3   Title  Patient reports 50-70% decrease in hip/LE pain 03/12/17    Time  6    Period  Weeks    Status  On-going      PT LONG TERM GOAL #4   Title  Independent in HEP with focus on appropriate stretching 03/12/17    Time  6    Period  Weeks    Status  On-going      PT LONG TERM GOAL #5   Title  Improve FOTO to </= 30%  limitation 03/12/17    Time  6    Period  Weeks    Status  On-going            Plan - 02/07/17 0932    Clinical Impression Statement  Persistent muscular tightness through the Rt hip adductors and flexors/ anterior thigh. Good response with increased tissue extensibility through musculature with manual work; DN; stretching; modalities. Encouraged pt to avoid weight training and activities that would shorten tissues involved/symptomatic.     Rehab Potential  Good    PT Frequency  2x / week    PT Duration  6 weeks    PT Treatment/Interventions  Patient/family education;ADLs/Self Care Home Management;Cryotherapy;Electrical Stimulation;Iontophoresis 4mg /ml Dexamethasone;Moist Heat;Ultrasound;Dry needling;Manual techniques;Neuromuscular re-education;Therapeutic activities;Therapeutic exercise    PT Next Visit Plan  assess response to manual therapy and DN to adductors/ hip musculature along with Rt pec/bicep. Continued education of postural strengthening / stretching.     Consulted and Agree with Plan of Care  Patient       Patient will benefit from skilled therapeutic intervention in order to improve the following deficits and impairments:  Postural dysfunction, Improper body mechanics, Pain, Increased muscle spasms, Impaired perceived functional ability, Hypomobility, Decreased mobility, Decreased range of motion, Decreased activity tolerance  Visit Diagnosis: Pain in right hip  Pain in left hip  Pain of right upper extremity  Other symptoms and signs involving the musculoskeletal system     Problem List Patient Active Problem List   Diagnosis Date Noted  . Primary osteoarthritis of both hips 01/20/2017  . Chronic right shoulder pain 01/20/2017  . Cough 08/19/2014  . Pertussis exposure 08/19/2014  . 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 ALLERGIC RHINITIS 05/29/2009  . BACK PAIN, THORACIC REGION 03/15/2009  . FOOT PAIN, LEFT 01/18/2009  . IRON DEFICIENCY 11/16/2007  .  DIZZINESS 11/16/2007    Ariz Terrones Rober Minion PT, MPH  02/07/2017,  9:35 AM  [facility] [address] [place], [place], [address] Phone: [phone]   Fax:  [phone]  Name: Laketha Leopard MRN: [id number] Date of Birth: 1970-04-13

## 2017-02-12 ENCOUNTER — Encounter: Payer: Self-pay | Admitting: Rehabilitative and Restorative Service Providers"

## 2017-02-12 ENCOUNTER — Ambulatory Visit (INDEPENDENT_AMBULATORY_CARE_PROVIDER_SITE_OTHER): Payer: 59 | Admitting: Rehabilitative and Restorative Service Providers"

## 2017-02-12 DIAGNOSIS — M25552 Pain in left hip: Secondary | ICD-10-CM | POA: Diagnosis not present

## 2017-02-12 DIAGNOSIS — R29898 Other symptoms and signs involving the musculoskeletal system: Secondary | ICD-10-CM | POA: Diagnosis not present

## 2017-02-12 DIAGNOSIS — M25551 Pain in right hip: Secondary | ICD-10-CM

## 2017-02-12 DIAGNOSIS — M79601 Pain in right arm: Secondary | ICD-10-CM

## 2017-02-12 NOTE — Therapy (Signed)
[facility] [address] [place], [place], [address] Phone: [phone]   Fax:  [phone]  Physical Therapy Treatment  Patient Details  Name: Julia Griffith MRN: [id number] Date of Birth: 1970-06-09 Referring Provider: Dr. Benjamin Stainhekkekandam   Encounter Date: 02/12/2017  PT End of Session - 02/12/17 0805    Visit Number  4    Number of Visits  12    Date for PT Re-Evaluation  03/12/17    PT Start Time  0802    PT Stop Time  0900    PT Time Calculation (min)  58 min    Activity Tolerance  Patient tolerated treatment well       Past Medical History:  Diagnosis Date  . Osteoarthritis of right hip 10/19/2012  . Postpartum depression     Past Surgical History:  Procedure Laterality Date  . LEEP    . Right TM repair    . thumb and middle finger surgery     overuse injury    There were no vitals filed for this visit.  Subjective Assessment - 02/12/17 0806    Subjective  Patient reports that she was sore from the needling but it helped loosen things up - really helps     Currently in Pain?  Yes    Pain Score  4     Pain Location  Hip    Pain Orientation  Right    Pain Descriptors / Indicators  Nagging    Pain Type  Chronic pain        Education re exercise. Suggested patient exercise in the water - walking backwards and sidesteps. Hip extension and abduction exercises               [facility] Adult PT Treatment/Exercise - 02/12/17 0001      Knee/Hip Exercises: Stretches   Passive Hamstring Stretch  Right;Left;2 reps;30 seconds    Quad Stretch  Right;Left;2 reps;20 seconds prone with strap     Hip Flexor Stretch  2 reps;30 seconds both knees to chest dropping Rt LE off table     ITB Stretch  Right;3 reps;20 seconds;Limitations    Other Knee/Hip Stretches  hip adductor stretch supine with strap 30 sec x 2     Other Knee/Hip Stretches  butterfly x 30 sec bilat      Knee/Hip Exercises: Standing   Hip  Abduction  AROM;Stengthening;Right;Left;10 reps leading with heel     Hip Extension  AROM;Stengthening;Right;Left;10 reps      Moist Heat Therapy   Number Minutes Moist Heat  20 Minutes    Moist Heat Location  Hip anterior; medial thigh/quads      Electrical Stimulation   Electrical Stimulation Location  medial thigh - adductors; quads Rt LE     Electrical Stimulation Action  IFC    Electrical Stimulation Parameters  to tolerance    Electrical Stimulation Goals  Tone;Pain      Manual Therapy   Manual therapy comments  pt supine     Soft tissue mobilization  deep tissue work through the Rt hip and thigh in adductors; quads; hip flexors    Myofascial Release  anterior and medial Rt thigh        Trigger Point Dry Needling - 02/12/17 [id number]    Consent Given?  Yes    Muscles Treated Lower Body  -- Rt with estim     Tensor Fascia Lata Response  Palpable increased muscle length    Quadriceps Response  Palpable increased muscle length    Adductor Response  Palpable increased muscle length           PT Education - 02/12/17 0833    Education provided  Yes    Education Details  HEP     Person(s) Educated  Patient    Methods  Explanation;Demonstration;Tactile cues;Verbal cues;Handout    Comprehension  Verbalized understanding;Returned demonstration;Verbal cues required;Tactile cues required          PT Long Term Goals - 02/12/17 0826      PT LONG TERM GOAL #1   Title  Improve posture and alignment with patient to demonstrate good upright posture with hips extended and knees out of hyperextension. 03/12/17    Time  6    Period  Weeks    Status  On-going      PT LONG TERM GOAL #2   Title  Improve joint mobility and soft tissue extensibility in bilat LE's and trunk with patient demonstrating good joint ROM bilat hips 03/12/17    Time  6    Period  Weeks    Status  On-going      PT LONG TERM GOAL #3   Title  Patient reports 50-70% decrease in hip/LE pain 03/12/17    Time  6     Period  Weeks    Status  On-going      PT LONG TERM GOAL #4   Title  Independent in HEP with focus on appropriate stretching 03/12/17    Time  6    Period  Weeks    Status  On-going      PT LONG TERM GOAL #5   Title  Improve FOTO to </= 30% limitation 03/12/17    Time  6    Period  Weeks    Status  On-going            Plan - 02/12/17 0810    Clinical Impression Statement  Good response to DN with decreased pain and imporved tissue extensibility. Progressing well toward stated goals of therapy.     Rehab Potential  Good    PT Frequency  2x / week    PT Duration  6 weeks    PT Treatment/Interventions  Patient/family education;ADLs/Self Care Home Management;Cryotherapy;Electrical Stimulation;Iontophoresis 4mg /ml Dexamethasone;Moist Heat;Ultrasound;Dry needling;Manual techniques;Neuromuscular re-education;Therapeutic activities;Therapeutic exercise    PT Next Visit Plan  continue manual therapy and DN to adductors/ hip musculature along with Rt pec/bicep. Continued education of postural strengthening / stretching.     Consulted and Agree with Plan of Care  Patient       Patient will benefit from skilled therapeutic intervention in order to improve the following deficits and impairments:  Postural dysfunction, Improper body mechanics, Pain, Increased muscle spasms, Impaired perceived functional ability, Hypomobility, Decreased mobility, Decreased range of motion, Decreased activity tolerance  Visit Diagnosis: Pain in right hip  Pain in left hip  Pain of right upper extremity  Other symptoms and signs involving the musculoskeletal system     Problem List Patient Active Problem List   Diagnosis Date Noted  . Primary osteoarthritis of both hips 01/20/2017  . Chronic right shoulder pain 01/20/2017  . Cough 08/19/2014  . Pertussis exposure 08/19/2014  . 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 ALLERGIC RHINITIS 05/29/2009  . BACK PAIN, THORACIC REGION 03/15/2009  . FOOT PAIN,  LEFT 01/18/2009  . IRON DEFICIENCY 11/16/2007  . DIZZINESS 11/16/2007    Tascha Casares Rober Minion PT, MPH  02/12/2017, 8:34 AM  [facility] [address] [place], [place], [address] Phone: [phone]   Fax:  [phone]  Name: Julia Griffith MRN: [id number] Date of Birth: December 06, 1970

## 2017-02-12 NOTE — Patient Instructions (Addendum)
Walking backwards and sidesteps in water   Strengthening: Hip Abduction - Resisted    Face wall, extend leg out from side leading with heel  Repeat _10___ times per set. Do __2-3__ sets per session. Do __1__ sessions per day.   Strengthening: Hip Extension - Resisted    face wall and pull leg straight back. Repeat ___10_ times per set. Do __2-3__ sets per session. Do __1__ sessions per day.

## 2017-02-14 ENCOUNTER — Encounter: Payer: Self-pay | Admitting: Physical Therapy

## 2017-02-14 ENCOUNTER — Ambulatory Visit (INDEPENDENT_AMBULATORY_CARE_PROVIDER_SITE_OTHER): Payer: 59 | Admitting: Physical Therapy

## 2017-02-14 DIAGNOSIS — M25551 Pain in right hip: Secondary | ICD-10-CM

## 2017-02-14 DIAGNOSIS — M25552 Pain in left hip: Secondary | ICD-10-CM

## 2017-02-14 DIAGNOSIS — M79601 Pain in right arm: Secondary | ICD-10-CM

## 2017-02-14 NOTE — Therapy (Signed)
[place], [place], [address] Phone: [phone]   Fax:  [phone]  Physical Therapy Treatment  Patient Details  Name: Julia Griffith MRN: [id number] Date of Birth: January 07, 1971 Referring Provider: Dr. Dianah Field   Encounter Date: 02/14/2017  PT End of Session - 02/14/17 0807    Visit Number  5    Number of Visits  12    Date for PT Re-Evaluation  03/12/17    PT Start Time  0805    PT Stop Time  0910    PT Time Calculation (min)  65 min    Activity Tolerance  Patient tolerated treatment well    Behavior During Therapy  [facility] for tasks assessed/performed       Past Medical History:  Diagnosis Date  . Osteoarthritis of right hip 10/19/2012  . Postpartum depression     Past Surgical History:  Procedure Laterality Date  . LEEP    . Right TM repair    . thumb and middle finger surgery     overuse injury    There were no vitals filed for this visit.  Subjective Assessment - 02/14/17 0807    Subjective  "My hips are just going to be a process".  She reports a gradual improvement. Dry needling is helping.     Patient Stated Goals  increase flexibility and ROM; decrease pain in hips and Rt biceps     Currently in Pain?  No/denies    Pain Score  0-No pain "just discomfort, not enough for a number"         [facility] PT Assessment - 02/14/17 0001      Assessment   Medical Diagnosis  Bilat hip pain; Rt biceps pain     Referring Provider  Dr. Dianah Field    Onset Date/Surgical Date  01/07/10 7 years for hips; 12/07/16 for Rt biceps     Hand Dominance  Right    Next MD Visit  follow up not yet scheduled       Palpation   SI assessment   Rt ilium higher, Rt ASIS lower, Rt sacral torsion, tender pubic bone and Rt psoas.   PSIS and pubic bone symmetry - difficulty assessing.         [place] Adult PT Treatment/Exercise - 02/14/17 0001      Knee/Hip Exercises: Stretches   Passive Hamstring  Stretch  Right;Left;2 reps;30 seconds    ITB Stretch  Right;Left;2 reps;30 seconds supine with strap    Piriformis Stretch  Right;2 reps;30 seconds    Other Knee/Hip Stretches  hip adductor stretch supine with strap 30 sec x 2     Other Knee/Hip Stretches  butterfly x 30 sec bilat      Knee/Hip Exercises: Aerobic   Nustep  L5: legs only 6 min  PTA present to discuss progress      Knee/Hip Exercises: Sidelying   Hip ABduction  Strengthening;Right;1 set;10 reps    Other Sidelying Knee/Hip Exercises  pilates hot potato x 10 RLE, began cramping in posterior hip at end - followed with stretch for relief.       Moist Heat Therapy   Number Minutes Moist Heat  15 Minutes    Moist Heat Location  Hip anterior; medial thigh/quads      Electrical Stimulation   Electrical Stimulation Location  medial thigh - adductors; quads Rt LE     Electrical Stimulation Action  IFC    Electrical Stimulation Parameters   to  tolerance     Electrical Stimulation Goals  Tone;Pain      Manual Therapy   Manual Therapy  Muscle Energy Technique;Myofascial release;Soft tissue mobilization    Soft tissue mobilization  STM and deep tissue work to Rt adductor group.  TPR to Rt adductors and medial hamstring, including contract/relax of muscles)     Myofascial Release  Rt adductor/ Rt medial hamstring.     Muscle Energy Technique  MET to correct elevated Rt ilium (in Rt sidelying- limited tolerance due to Lt hip discomfort); MET to correct Rt sacral torsion in prone.                   PT Long Term Goals - 02/14/17 0809      PT LONG TERM GOAL #1   Title  Improve posture and alignment with patient to demonstrate good upright posture with hips extended and knees out of hyperextension. 03/12/17    Time  6    Period  Weeks    Status  On-going      PT LONG TERM GOAL #2   Title  Improve joint mobility and soft tissue extensibility in bilat LE's and trunk with patient demonstrating good joint ROM bilat hips 03/12/17     Time  6    Period  Weeks    Status  On-going      PT LONG TERM GOAL #3   Title  Patient reports 50-70% decrease in hip/LE pain 03/12/17    Time  6    Period  Weeks    Status  On-going 20% improvement reported 02/14/17      PT LONG TERM GOAL #4   Title  Independent in HEP with focus on appropriate stretching 03/12/17    Time  6    Period  Weeks    Status  On-going      PT LONG TERM GOAL #5   Title  Improve FOTO to </= 30% limitation 03/12/17    Time  6    Period  Weeks    Status  On-going            Plan - 02/14/17 0810    Clinical Impression Statement  Pt reporting 20% improvement in hip/LE pain.  Pt having positive response with dry needling. Pt has some pelvis asymmetries; some improvement with MET corrections.  Pt very point tender at ASIS and psoas, as well as superior pubic bone (where rectus abd attach).  Pt making gradual progress towards goals.     Rehab Potential  Good    PT Frequency  2x / week    PT Duration  6 weeks    PT Next Visit Plan  assess pelvis alignment; continue manual therapy and DN to RLE.      Consulted and Agree with Plan of Care  Patient       Patient will benefit from skilled therapeutic intervention in order to improve the following deficits and impairments:  Postural dysfunction, Improper body mechanics, Pain, Increased muscle spasms, Impaired perceived functional ability, Hypomobility, Decreased mobility, Decreased range of motion, Decreased activity tolerance  Visit Diagnosis: Pain in right hip  Pain in left hip  Pain of right upper extremity     Problem List Patient Active Problem List   Diagnosis Date Noted  . Primary osteoarthritis of both hips 01/20/2017  . Chronic right shoulder pain 01/20/2017  . Cough 08/19/2014  . Pertussis exposure 08/19/2014  . 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 ALLERGIC RHINITIS 05/29/2009  .  BACK PAIN, THORACIC REGION 03/15/2009  . FOOT PAIN, LEFT 01/18/2009  . IRON DEFICIENCY 11/16/2007  .  DIZZINESS 11/16/2007   Kerin Perna, PTA 02/14/17 9:43 AM  [facility] [place], [place], [address] Phone: [phone]   Fax:  [phone]  Name: Julia Griffith MRN: [id number] Date of Birth: 08-Feb-1970

## 2017-02-19 ENCOUNTER — Ambulatory Visit (INDEPENDENT_AMBULATORY_CARE_PROVIDER_SITE_OTHER): Payer: 59 | Admitting: Rehabilitative and Restorative Service Providers"

## 2017-02-19 ENCOUNTER — Encounter: Payer: Self-pay | Admitting: Rehabilitative and Restorative Service Providers"

## 2017-02-19 DIAGNOSIS — M25551 Pain in right hip: Secondary | ICD-10-CM

## 2017-02-19 DIAGNOSIS — R29898 Other symptoms and signs involving the musculoskeletal system: Secondary | ICD-10-CM | POA: Diagnosis not present

## 2017-02-19 DIAGNOSIS — M79601 Pain in right arm: Secondary | ICD-10-CM | POA: Diagnosis not present

## 2017-02-19 DIAGNOSIS — M25552 Pain in left hip: Secondary | ICD-10-CM

## 2017-02-19 NOTE — Therapy (Signed)
[facility] [address] [place], [place], [address] Phone: [phone]   Fax:  [phone]  Physical Therapy Treatment  Patient Details  Name: Julia Griffith MRN: [id number] Date of Birth: 13-Jun-1970 Referring Provider: Dr. Benjamin Stainhekkekandam   Encounter Date: 02/19/2017  PT End of Session - 02/19/17 0716    Visit Number  6    Number of Visits  12    Date for PT Re-Evaluation  03/12/17    PT Start Time  0714    PT Stop Time  0815    PT Time Calculation (min)  61 min    Activity Tolerance  Patient tolerated treatment well       Past Medical History:  Diagnosis Date  . Osteoarthritis of right hip 10/19/2012  . Postpartum depression     Past Surgical History:  Procedure Laterality Date  . LEEP    . Right TM repair    . thumb and middle finger surgery     overuse injury    There were no vitals filed for this visit.  Subjective Assessment - 02/19/17 0717    Subjective  Patient reports that yesterday was not a good day. She may have slept wrong. He back even hurt yesterday. DN was helpful. Soreness resolved in ~2 hours.     Currently in Pain?  Yes    Pain Score  4     Pain Location  Hip    Pain Orientation  Right;Left    Pain Descriptors / Indicators  Discomfort;Tightness    Pain Type  Chronic pain                      [facility] Adult PT Treatment/Exercise - 02/19/17 0001      Knee/Hip Exercises: Stretches   Passive Hamstring Stretch  Right;Left;2 reps;30 seconds    ITB Stretch  Right;Left;2 reps;30 seconds supine with strap    Piriformis Stretch  Right;2 reps;30 seconds    Other Knee/Hip Stretches  hip adductor stretch supine with strap 30 sec x 2     Other Knee/Hip Stretches  butterfly x 30 sec bilat      Moist Heat Therapy   Number Minutes Moist Heat  20 Minutes    Moist Heat Location  Hip anterior; medial thigh/quads      Electrical Stimulation   Electrical Stimulation Location  medial  thigh - adductors; quads Rt LE     Electrical Stimulation Action  IFC    Electrical Stimulation Parameters  to tolerance    Electrical Stimulation Goals  Tone;Pain      Manual Therapy   Manual therapy comments  pt supine     Soft tissue mobilization  STM and deep tissue work to Rt adductor group.  TPR to Rt adductors and medial hamstring    Myofascial Release  Rt adductor/flexors/Rt medial hamstring.        Trigger Point Dry Needling - 02/19/17 0801    Consent Given?  Yes    Muscles Treated Lower Body  -- Rt with stim; Lt     Quadriceps Response  Palpable increased muscle length    Adductor Response  Palpable increased muscle length                PT Long Term Goals - 02/19/17 0717      PT LONG TERM GOAL #1   Title  Improve posture and alignment with patient to demonstrate good upright posture with hips extended and knees out  of hyperextension. 03/12/17    Time  6    Period  Weeks    Status  On-going      PT LONG TERM GOAL #2   Title  Improve joint mobility and soft tissue extensibility in bilat LE's and trunk with patient demonstrating good joint ROM bilat hips 03/12/17    Time  6    Period  Weeks    Status  On-going      PT LONG TERM GOAL #3   Title  Patient reports 50-70% decrease in hip/LE pain 03/12/17    Time  6    Period  Weeks    Status  On-going      PT LONG TERM GOAL #4   Title  Independent in HEP with focus on appropriate stretching 03/12/17    Time  6    Period  Weeks    Status  On-going      PT LONG TERM GOAL #5   Title  Improve FOTO to </= 30% limitation 03/12/17    Time  6    Period  Weeks    Status  On-going            Plan - 02/19/17 1610    Clinical Impression Statement  Gradual improvement in pain and muscular tightness through hips. Responds well to DN and manual work. Progressing toward stated goals of therapy.     Rehab Potential  Good    PT Frequency  2x / week    PT Duration  6 weeks    PT Treatment/Interventions  Patient/family  education;ADLs/Self Care Home Management;Cryotherapy;Electrical Stimulation;Iontophoresis 4mg /ml Dexamethasone;Moist Heat;Ultrasound;Dry needling;Manual techniques;Neuromuscular re-education;Therapeutic activities;Therapeutic exercise    PT Next Visit Plan  assess pelvis alignment; continue manual therapy and DN to  LE's Rt > Lt.         Patient will benefit from skilled therapeutic intervention in order to improve the following deficits and impairments:  Postural dysfunction, Improper body mechanics, Pain, Increased muscle spasms, Impaired perceived functional ability, Hypomobility, Decreased mobility, Decreased range of motion, Decreased activity tolerance  Visit Diagnosis: Pain in right hip  Pain in left hip  Pain of right upper extremity  Other symptoms and signs involving the musculoskeletal system     Problem List Patient Active Problem List   Diagnosis Date Noted  . Primary osteoarthritis of both hips 01/20/2017  . Chronic right shoulder pain 01/20/2017  . Cough 08/19/2014  . Pertussis exposure 08/19/2014  . 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 ALLERGIC RHINITIS 05/29/2009  . BACK PAIN, THORACIC REGION 03/15/2009  . FOOT PAIN, LEFT 01/18/2009  . IRON DEFICIENCY 11/16/2007  . DIZZINESS 11/16/2007    Domanick Cuccia Rober Minion PT, MPH 02/19/2017, 8:02 AM  [facility] [address] [place], [place], [address] Phone: [phone]   Fax:  [phone]  Name: Julia Griffith MRN: [id number] Date of Birth: March 04, 1970

## 2017-02-21 ENCOUNTER — Ambulatory Visit (INDEPENDENT_AMBULATORY_CARE_PROVIDER_SITE_OTHER): Payer: 59 | Admitting: Rehabilitative and Restorative Service Providers"

## 2017-02-21 DIAGNOSIS — M25551 Pain in right hip: Secondary | ICD-10-CM | POA: Diagnosis not present

## 2017-02-21 DIAGNOSIS — M25552 Pain in left hip: Secondary | ICD-10-CM | POA: Diagnosis not present

## 2017-02-21 DIAGNOSIS — R29898 Other symptoms and signs involving the musculoskeletal system: Secondary | ICD-10-CM

## 2017-02-21 DIAGNOSIS — M79601 Pain in right arm: Secondary | ICD-10-CM

## 2017-02-21 NOTE — Therapy (Signed)
[place], [place], [address] Phone: [phone]   Fax:  [phone]  Physical Therapy Treatment  Patient Details  Name: Julia Griffith MRN: [id number] Date of Birth: 02-28-1970 Referring Provider: Dr. Dianah Field   Encounter Date: 02/21/2017  PT End of Session - 02/21/17 0808    Visit Number  7    Number of Visits  12    Date for PT Re-Evaluation  03/12/17    PT Start Time  0806       Past Medical History:  Diagnosis Date  . Osteoarthritis of right hip 10/19/2012  . Postpartum depression     Past Surgical History:  Procedure Laterality Date  . LEEP    . Right TM repair    . thumb and middle finger surgery     overuse injury    There were no vitals filed for this visit.  Subjective Assessment - 02/21/17 0808    Subjective  Pt feeling about the same as last session.      Currently in Pain?  Yes    Pain Score  2     Pain Location  Hip    Pain Orientation  Right    Pain Descriptors / Indicators  Dull;Tightness    Aggravating Factors   sitting    Pain Relieving Factors  dry needling, stretching, yoga, massage         [facility] PT Assessment - 02/21/17 0001      Palpation   SI assessment   Lt ASIS higher than Rt; Lt leg appears longer in supine; Rt sacral torsion;                   [facility] Adult PT Treatment/Exercise - 02/21/17 0001      Knee/Hip Exercises: Stretches   Passive Hamstring Stretch  Right;Left;2 reps;30 seconds    ITB Stretch  Right;Left;2 reps;30 seconds supine with strap    Other Knee/Hip Stretches  hip adductor stretch supine with strap 30 sec x 2       Moist Heat Therapy   Number Minutes Moist Heat  20 Minutes    Moist Heat Location  Hip anterior; medial thigh/quads      Electrical Stimulation   Electrical Stimulation Location  medial thigh - lateral thigh Rt LE     Electrical Stimulation Action  IFC    Electrical Stimulation Parameters  to  tolerance    Electrical Stimulation Goals  Tone;Pain      Manual Therapy   Manual therapy comments  pt supine     Soft tissue mobilization  STM and deep tissue work to Rt adductor group.  TPR to Rt adductors and medial hamstring    Myofascial Release  Rt adductor/flexors/Rt medial hamstring.     Muscle Energy Technique  MET to correct ant rotated Rt ilium (in supine with hamstring contract/relax); MET to correct Rt sacral torsion in prone.        Trigger Point Dry Needling - 02/21/17 0845    Consent Given?  Yes    Tensor Fascia Lata Response  Palpable increased muscle length    Quadriceps Response  Palpable increased muscle length    Adductor Response  Palpable increased muscle length                PT Long Term Goals - 02/19/17 0717      PT LONG TERM GOAL #1   Title  Improve posture and alignment with patient to demonstrate good  upright posture with hips extended and knees out of hyperextension. 03/12/17    Time  6    Period  Weeks    Status  On-going      PT LONG TERM GOAL #2   Title  Improve joint mobility and soft tissue extensibility in bilat LE's and trunk with patient demonstrating good joint ROM bilat hips 03/12/17    Time  6    Period  Weeks    Status  On-going      PT LONG TERM GOAL #3   Title  Patient reports 50-70% decrease in hip/LE pain 03/12/17    Time  6    Period  Weeks    Status  On-going      PT LONG TERM GOAL #4   Title  Independent in HEP with focus on appropriate stretching 03/12/17    Time  6    Period  Weeks    Status  On-going      PT LONG TERM GOAL #5   Title  Improve FOTO to </= 30% limitation 03/12/17    Time  6    Period  Weeks    Status  On-going            Plan - 02/21/17 7530    Clinical Impression Statement  Good response to DN and manual work. Correction of anterior rotation of pelvis today. Cotninues to demonstrate gradual improvement.     Rehab Potential  Good    PT Frequency  2x / week    PT Treatment/Interventions   Patient/family education;ADLs/Self Care Home Management;Cryotherapy;Electrical Stimulation;Iontophoresis 54m/ml Dexamethasone;Moist Heat;Ultrasound;Dry needling;Manual techniques;Neuromuscular re-education;Therapeutic activities;Therapeutic exercise    PT Next Visit Plan  assess pelvis alignment; continue manual therapy and DN to  LE's Rt > Lt.      Consulted and Agree with Plan of Care  Patient       Patient will benefit from skilled therapeutic intervention in order to improve the following deficits and impairments:  Postural dysfunction, Improper body mechanics, Pain, Increased muscle spasms, Impaired perceived functional ability, Hypomobility, Decreased mobility, Decreased range of motion, Decreased activity tolerance  Visit Diagnosis: Pain in left hip  Pain of right upper extremity  Other symptoms and signs involving the musculoskeletal system  Pain in right hip     Problem List Patient Active Problem List   Diagnosis Date Noted  . Primary osteoarthritis of both hips 01/20/2017  . Chronic right shoulder pain 01/20/2017  . Cough 08/19/2014  . Pertussis exposure 08/19/2014  . 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 ALLERGIC RHINITIS 05/29/2009  . BACK PAIN, THORACIC REGION 03/15/2009  . FOOT PAIN, LEFT 01/18/2009  . IRON DEFICIENCY 11/16/2007  . DIZZINESS 11/16/2007    Markice Torbert PNilda SimmerPT, MPH  02/21/2017, 8:48 AM  [facility] [address]Phone: [phone]  Fax:  [phone] Name: Julia DicostanzoMRN: [id number] of Birth: [birth year]

## 2017-02-26 ENCOUNTER — Ambulatory Visit (INDEPENDENT_AMBULATORY_CARE_PROVIDER_SITE_OTHER): Payer: 59 | Admitting: Rehabilitative and Restorative Service Providers"

## 2017-02-26 ENCOUNTER — Encounter: Payer: Self-pay | Admitting: Rehabilitative and Restorative Service Providers"

## 2017-02-26 DIAGNOSIS — M79601 Pain in right arm: Secondary | ICD-10-CM | POA: Diagnosis not present

## 2017-02-26 DIAGNOSIS — M25551 Pain in right hip: Secondary | ICD-10-CM | POA: Diagnosis not present

## 2017-02-26 DIAGNOSIS — R29898 Other symptoms and signs involving the musculoskeletal system: Secondary | ICD-10-CM | POA: Diagnosis not present

## 2017-02-26 DIAGNOSIS — M25552 Pain in left hip: Secondary | ICD-10-CM
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
## 2017-02-28 ENCOUNTER — Ambulatory Visit (INDEPENDENT_AMBULATORY_CARE_PROVIDER_SITE_OTHER): Payer: 59 | Admitting: Physical Therapy

## 2017-02-28 DIAGNOSIS — M25552 Pain in left hip: Secondary | ICD-10-CM

## 2017-02-28 DIAGNOSIS — M25551 Pain in right hip: Secondary | ICD-10-CM

## 2017-02-28 DIAGNOSIS — R29898 Other symptoms and signs involving the musculoskeletal system: Secondary | ICD-10-CM | POA: Diagnosis not present
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
## 2017-03-04 ENCOUNTER — Ambulatory Visit (INDEPENDENT_AMBULATORY_CARE_PROVIDER_SITE_OTHER): Payer: 59 | Admitting: Physical Therapy

## 2017-03-04 DIAGNOSIS — M25552 Pain in left hip: Secondary | ICD-10-CM | POA: Diagnosis not present

## 2017-03-04 DIAGNOSIS — R29898 Other symptoms and signs involving the musculoskeletal system: Secondary | ICD-10-CM

## 2017-03-04 DIAGNOSIS — M25551 Pain in right hip: Secondary | ICD-10-CM

## 2017-03-04 DIAGNOSIS — M79601 Pain in right arm: Secondary | ICD-10-CM | POA: Diagnosis not present
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
## 2017-03-05 ENCOUNTER — Encounter: Payer: 59 | Admitting: Rehabilitative and Restorative Service Providers"

## 2017-03-12 ENCOUNTER — Ambulatory Visit (INDEPENDENT_AMBULATORY_CARE_PROVIDER_SITE_OTHER): Payer: 59 | Admitting: Rehabilitative and Restorative Service Providers"

## 2017-03-12 ENCOUNTER — Encounter: Payer: Self-pay | Admitting: Rehabilitative and Restorative Service Providers"

## 2017-03-12 DIAGNOSIS — M25552 Pain in left hip: Secondary | ICD-10-CM | POA: Diagnosis not present

## 2017-03-12 DIAGNOSIS — R29898 Other symptoms and signs involving the musculoskeletal system: Secondary | ICD-10-CM | POA: Diagnosis not present

## 2017-03-12 DIAGNOSIS — M79601 Pain in right arm: Secondary | ICD-10-CM

## 2017-03-12 DIAGNOSIS — M25551 Pain in right hip: Secondary | ICD-10-CM | POA: Diagnosis not present
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
## 2017-03-14 ENCOUNTER — Ambulatory Visit (INDEPENDENT_AMBULATORY_CARE_PROVIDER_SITE_OTHER): Payer: 59 | Admitting: Physical Therapy

## 2017-03-14 DIAGNOSIS — R29898 Other symptoms and signs involving the musculoskeletal system: Secondary | ICD-10-CM

## 2017-03-14 DIAGNOSIS — M25551 Pain in right hip: Secondary | ICD-10-CM | POA: Diagnosis not present

## 2017-03-14 NOTE — Therapy (Signed)
[place], [place], [address] Phone: [phone]   Fax:  [phone]  Physical Therapy Treatment  Patient Details  Name: Julia Griffith MRN: [id number] Date of Birth: 1970/10/30 Referring Provider: Dr. Dianah Field   Encounter Date: 03/14/2017  PT End of Session - 03/14/17 0805    Visit Number  12    Number of Visits  24    Date for PT Re-Evaluation  04/23/17    PT Start Time  0801    PT Stop Time  0905    PT Time Calculation (min)  64 min    Activity Tolerance  Patient tolerated treatment well    Behavior During Therapy  [facility] for tasks assessed/performed       Past Medical History:  Diagnosis Date  . Osteoarthritis of right hip 10/19/2012  . Postpartum depression     Past Surgical History:  Procedure Laterality Date  . LEEP    . Right TM repair    . thumb and middle finger surgery     overuse injury    There were no vitals filed for this visit.  Subjective Assessment - 03/14/17 0806    Subjective  Pt thinks that overstretching may flare up her symptoms. She didn't do any stretches yesterday. She felt occasional "twinging on Rt inner thigh, but nothing terrible"    Patient Stated Goals  increase flexibility and ROM; decrease pain in hips and Rt biceps     Currently in Pain?  No/denies    Pain Score  0-No pain just Tightness         [facility] PT Assessment - 03/14/17 0001      Assessment   Medical Diagnosis  Bilat hip pain; Rt biceps pain     Referring Provider  Dr. Dianah Field    Onset Date/Surgical Date  01/07/10 7 yrs hip pain     Hand Dominance  Right    Next MD Visit  03/20/17      Palpation   SI assessment   Rt ASIS lower than Lt. Rt sacral torsion.        [facility] Adult PT Treatment/Exercise - 03/14/17 0001      Knee/Hip Exercises: Stretches   Passive Hamstring Stretch  Right;Left;2 reps;30 seconds    Hip Flexor Stretch  30 seconds;3 reps RLE; leg off of table    Other  Knee/Hip Stretches  hip adductor stretch supine with strap 30 sec x 2     Other Knee/Hip Stretches  AROM circles in supine with Rt/Lt hip - caused some cramping in groin after 5 circles.       Modalities   Modalities  Ultrasound      Moist Heat Therapy   Number Minutes Moist Heat  20 Minutes    Moist Heat Location  Hip lateral and medial thigh of RLE      Electrical Stimulation   Electrical Stimulation Location  Rt adductors/  Rt glute and piriformis.     Electrical Stimulation Action  premod to each area    Printmaker Parameters  to tolerance     Electrical Stimulation Goals  Tone;Pain      Ultrasound   Ultrasound Location  Rt glute med and piriformis     Ultrasound Parameters  combo [place]- 100%, 1.4 w/cm2, 8 min     Ultrasound Goals  Pain;Other (Comment) tightness.       Manual Therapy   Manual therapy comments  pt in Lt sidelying.  Soft tissue mobilization  TPR to Rt hip rotators, glute med and max.     Myofascial Release  Rt distal glute max and lateral hamstring.     Muscle Energy Technique  MET to correct anteriorly rotated Rt ilium (in supine with hamstring contract/relax); MET to correct Rt sacral torsion in prone.                   PT Long Term Goals - 03/14/17 2440      PT LONG TERM GOAL #1   Title  Improve posture and alignment with patient to demonstrate good upright posture with hips extended and knees out of hyperextension. 04/23/17    Time  12    Period  Weeks    Status  On-going      PT LONG TERM GOAL #2   Title  Improve joint mobility and soft tissue extensibility in bilat LE's and trunk with patient demonstrating good joint ROM bilat hips 04/23/17    Time  12    Period  Weeks    Status  On-going      PT LONG TERM GOAL #3   Title  Patient reports 50-70% decrease in hip/LE pain 04/23/17    Time  12    Period  Weeks    Status  Partially met  50% reduction reported 03/14/17      PT LONG TERM GOAL #4   Title  Independent in HEP with  focus on appropriate stretching 04/23/17    Time  12    Period  Weeks    Status  On-going      PT LONG TERM GOAL #5   Title  Improve FOTO to </= 30% limitation 04/23/17    Time  12    Period  Weeks    Status  On-going            Plan - 03/14/17 0854    Clinical Impression Statement  Improvement in tightness of Rt adductors, as well as pelvis alignment. Pt reporting overall reduction in pain since initiating therapy.  She has partially met LTG#3.    Rehab Potential  Good    PT Frequency  2x / week    PT Duration  6 weeks    PT Treatment/Interventions  Patient/family education;ADLs/Self Care Home Management;Cryotherapy;Electrical Stimulation;Iontophoresis 42m/ml Dexamethasone;Moist Heat;Ultrasound;Dry needling;Manual techniques;Neuromuscular re-education;Therapeutic activities;Therapeutic exercise    PT Next Visit Plan  assess pelvis alignment as indicated; continue DN and manual therapy.     Consulted and Agree with Plan of Care  Patient       Patient will benefit from skilled therapeutic intervention in order to improve the following deficits and impairments:  Postural dysfunction, Improper body mechanics, Pain, Increased muscle spasms, Impaired perceived functional ability, Hypomobility, Decreased mobility, Decreased range of motion, Decreased activity tolerance  Visit Diagnosis: Pain in right hip  Other symptoms and signs involving the musculoskeletal system     Problem List Patient Active Problem List   Diagnosis Date Noted  . Primary osteoarthritis of both hips 01/20/2017  . Chronic right shoulder pain 01/20/2017  . Cough 08/19/2014  . Pertussis exposure 08/19/2014  . 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 ALLERGIC RHINITIS 05/29/2009  . BACK PAIN, THORACIC REGION 03/15/2009  . FOOT PAIN, LEFT 01/18/2009  . IRON DEFICIENCY 11/16/2007  . DIZZINESS 11/16/2007   JKerin Perna PTA 03/14/17 3:25 PM  [place] [address]Phone: [phone]  Fax:  [phone]  Name: Julia Griffith MRN: [id number] Date of Birth: 1970/10/25

## 2017-03-19 ENCOUNTER — Ambulatory Visit (INDEPENDENT_AMBULATORY_CARE_PROVIDER_SITE_OTHER): Payer: 59 | Admitting: Rehabilitative and Restorative Service Providers"

## 2017-03-19 ENCOUNTER — Encounter: Payer: Self-pay | Admitting: Rehabilitative and Restorative Service Providers"

## 2017-03-19 DIAGNOSIS — M79601 Pain in right arm: Secondary | ICD-10-CM

## 2017-03-19 DIAGNOSIS — M25551 Pain in right hip: Secondary | ICD-10-CM | POA: Diagnosis not present

## 2017-03-19 DIAGNOSIS — R29898 Other symptoms and signs involving the musculoskeletal system: Secondary | ICD-10-CM

## 2017-03-19 DIAGNOSIS — M25552 Pain in left hip: Secondary | ICD-10-CM | POA: Diagnosis not present

## 2017-03-19 NOTE — Therapy (Signed)
[place], [place], [address] Phone: [phone]   Fax:  [phone]  Physical Therapy Treatment  Patient Details  Name: Julia Griffith MRN: [id number] Date of Birth: [birth year] Referring Provider: Dr Dianah Field   Encounter Date: 03/19/2017  PT End of Session - 03/19/17 0717    Visit Number  13    Number of Visits  24    Date for PT Re-Evaluation  04/23/17    PT Start Time  0714    PT Stop Time  0812    PT Time Calculation (min)  58 min    Activity Tolerance  Patient tolerated treatment well       Past Medical History:  Diagnosis Date  . Osteoarthritis of right hip 10/19/2012  . Postpartum depression     Past Surgical History:  Procedure Laterality Date  . LEEP    . Right TM repair    . thumb and middle finger surgery     overuse injury    There were no vitals filed for this visit.  Subjective Assessment - 03/19/17 0755    Subjective  Doing well. Julia Griffith feels she is at least 50% improved overall. She has less pain and good gains in mobility through hips. She worked out yesterday without increase in pain/symptoms. Pleased with progress and would like to continue with therapy to gain mobilty and progress exercise.     Currently in Pain?  Yes    Pain Score  1     Pain Location  Hip    Pain Orientation  Right    Pain Descriptors / Indicators  Aching;Tightness    Pain Type  Chronic pain    Pain Radiating Towards  inner thigh     Pain Onset  More than a month ago    Pain Frequency  Intermittent    Aggravating Factors   sitting; some exercises     Pain Relieving Factors  stretching; massage; dry needling          [facility] PT Assessment - 03/19/17 0001      Assessment   Medical Diagnosis  Bilat hip pain; Rt biceps pain     Referring Provider  Dr Dianah Field    Onset Date/Surgical Date  01/07/10 7 yrs hip pain     Hand Dominance  Right    Next MD Visit  03/20/17      Observation/Other Assessments   Focus on Therapeutic Outcomes (FOTO)   42% limitation       AROM   Right/Left Hip  -- some minimal end range tightness Rt > Lt hip    Lumbar Flexion  85%    Lumbar Extension  30%    Lumbar - Right Side Bend  80%    Lumbar - Left Side Bend  80%    Lumbar - Right Rotation  35%    Lumbar - Left Rotation  40%      Strength   Overall Strength Comments  5/5 bilat LE's       Flexibility   Hamstrings  WFL's    Quadriceps  tight bilat     ITB  minimal tightness     Piriformis  tightness Rt > Lt                   [facility] Adult PT Treatment/Exercise - 03/19/17 0001      Knee/Hip Exercises: Stretches   Passive Hamstring Stretch  Right;Left;2 reps;30 seconds    Hip Flexor  Stretch  30 seconds;3 reps RLE; leg off of table    ITB Stretch  Right;Left;2 reps;30 seconds supine with strap     Other Knee/Hip Stretches  hip adductor stretch supine with strap 30 sec x 2       Moist Heat Therapy   Number Minutes Moist Heat  20 Minutes    Moist Heat Location  Hip Rt posterior hip and thigh       Electrical Stimulation   Electrical Stimulation Location  Rt posterior hip and hamstrings     Electrical Stimulation Action  IFC    Electrical Stimulation Parameters  to tolerance    Electrical Stimulation Goals  Tone;Pain      Manual Therapy   Manual therapy comments  pt in prone     Soft tissue mobilization  deep tissue work through the posterior hip, hamstrings, TFL, ITB     Myofascial Release  posterior thigh; lateral thigh        Trigger Point Dry Needling - 03/19/17 0749    Consent Given?  Yes    Muscles Treated Lower Body  -- Rt with estim     Tensor Fascia Lata Response  Palpable increased muscle length;Twitch response elicited    Hamstring Response  Palpable increased muscle length;Twitch response elicited                PT Long Term Goals - 03/19/17 0801      PT LONG TERM GOAL #1   Title  Improve posture and alignment with patient to  demonstrate good upright posture with hips extended and knees out of hyperextension. 04/23/17    Time  12    Status  Partially Met      PT LONG TERM GOAL #2   Title  Improve joint mobility and soft tissue extensibility in bilat LE's and trunk with patient demonstrating good joint ROM bilat hips 04/23/17    Time  12    Period  Weeks    Status  Partially Met      PT LONG TERM GOAL #3   Title  Patient reports 50-70% decrease in hip/LE pain 04/23/17    Time  12    Period  Weeks    Status  Partially Met 50% decrease in pain reported       PT LONG TERM GOAL #4   Title  Independent in HEP with focus on appropriate stretching 04/23/17    Time  12    Period  Weeks    Status  On-going      PT LONG TERM GOAL #5   Title  Improve FOTO to </= 30% limitation 04/23/17    Time  12    Period  Weeks    Status  On-going            Plan - 03/19/17 0759    Clinical Impression Statement  Gradual progress noted throughout the course of treatment. Julia Griffith has decreased muscular tightness thorugh Rt/Lt hips and improved pelvic alignment. She has decreased muscular tightness to palpation and improved tissue extensibility. Julia Griffith responds well to DN and stretching. She will continue with PT to achieve maximum rehab potential.     Rehab Potential  Good    PT Frequency  2x / week    PT Duration  6 weeks    PT Treatment/Interventions  Patient/family education;ADLs/Self Care Home Management;Cryotherapy;Electrical Stimulation;Iontophoresis 37m/ml Dexamethasone;Moist Heat;Ultrasound;Dry needling;Manual techniques;Neuromuscular re-education;Therapeutic activities;Therapeutic exercise    PT Next Visit Plan  assess pelvis alignment as indicated;  continue DN and manual therapy.     Consulted and Agree with Plan of Care  Patient       Patient will benefit from skilled therapeutic intervention in order to improve the following deficits and impairments:  Postural dysfunction, Improper body mechanics, Pain, Increased  muscle spasms, Impaired perceived functional ability, Hypomobility, Decreased mobility, Decreased range of motion, Decreased activity tolerance  Visit Diagnosis: Pain in right hip  Other symptoms and signs involving the musculoskeletal system  Pain in left hip  Pain of right upper extremity     Problem List Patient Active Problem List   Diagnosis Date Noted  . Primary osteoarthritis of both hips 01/20/2017  . Chronic right shoulder pain 01/20/2017  . Cough 08/19/2014  . Pertussis exposure 08/19/2014  . 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 ALLERGIC RHINITIS 05/29/2009  . BACK PAIN, THORACIC REGION 03/15/2009  . FOOT PAIN, LEFT 01/18/2009  . IRON DEFICIENCY 11/16/2007  . DIZZINESS 11/16/2007    Julia Griffith Nilda Simmer PT, MPH  03/19/2017, 8:03 AM  [facility] [place], [place], [address] Phone: [phone]   Fax:  [phone]  Name: Julia Griffith MRN: [id number] Date of Birth: [birth year]

## 2017-03-20 ENCOUNTER — Ambulatory Visit (INDEPENDENT_AMBULATORY_CARE_PROVIDER_SITE_OTHER): Payer: 59 | Admitting: Sports Medicine

## 2017-03-20 ENCOUNTER — Encounter: Payer: Self-pay | Admitting: Sports Medicine

## 2017-03-20 DIAGNOSIS — M160 Bilateral primary osteoarthritis of hip: Secondary | ICD-10-CM | POA: Diagnosis not present

## 2017-03-20 DIAGNOSIS — M25511 Pain in right shoulder: Secondary | ICD-10-CM

## 2017-03-20 DIAGNOSIS — G8929 Other chronic pain: Secondary | ICD-10-CM | POA: Diagnosis not present

## 2017-03-20 NOTE — Assessment & Plan Note (Signed)
Doing much better, she is using the supplements, not using any Celebrex. I am going to renew her physical therapy order.

## 2017-03-20 NOTE — Progress Notes (Signed)
  Subjective:    CC: Follow-up  HPI: Right shoulder pain: Mostly resolved with rehab exercises and over-the-counter supplements.  Bilateral hip osteoarthritis: Significantly better with over-the-counter supplements and aggressive formal physical therapy.  I reviewed the past medical history, family history, social history, surgical history, and allergies today and no changes were needed.  Please see the problem list section below in epic for further details.  Past Medical History: Past Medical History:  Diagnosis Date  . Osteoarthritis of right hip 10/19/2012  . Postpartum depression    Past Surgical History: Past Surgical History:  Procedure Laterality Date  . LEEP    . Right TM repair    . thumb and middle finger surgery     overuse injury   Social History: Social History   Socioeconomic History  . Marital status: Married    Spouse name: Fayrene FearingJames   . Number of children: 4   . Years of education: None  . Highest education level: None  Social Needs  . Financial resource strain: None  . Food insecurity - worry: None  . Food insecurity - inability: None  . Transportation needs - medical: None  . Transportation needs - non-medical: None  Occupational History  . Occupation: Post office   Tobacco Use  . Smoking status: Former Smoker    Last attempt to quit: 01/08/2000    Years since quitting: 17.2  . Smokeless tobacco: Never Used  Substance and Sexual Activity  . Alcohol use: Yes  . Drug use: No  . Sexual activity: None  Other Topics Concern  . 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    Family History: Family History  Problem Relation Age of Onset  . Skin cancer Mother        Basal cell  . Cancer Mother        skin   Allergies: No Known Allergies Medications: See med rec.  Review of Systems: No fevers, chills, night sweats, weight loss, chest pain, or shortness of breath.   Objective:    General: Well Developed, well  nourished, and in no acute distress.  Neuro: Alert and oriented x3, extra-ocular muscles intact, sensation grossly intact.  HEENT: Normocephalic, atraumatic, pupils equal round reactive to light, neck supple, no masses, no lymphadenopathy, thyroid nonpalpable.  Skin: Warm and dry, no rashes. Cardiac: Regular rate and rhythm, no murmurs rubs or gallops, no lower extremity edema.  Respiratory: Clear to auscultation bilaterally. Not using accessory muscles, speaking in full sentences.  Impression and Recommendations:    Primary osteoarthritis of both hips Doing much better, she is using the supplements, not using any Celebrex. I am going to renew her physical therapy order.   Chronic right shoulder pain Likely bicipital tendinitis. Continue with physical therapy, continues to improve. Again, if she has persistent pain at a 1 month follow-up we can push a little harder for her to start Celebrex, she has not done this yet.  I spent 25 minutes with this patient, greater than 50% was face-to-face time counseling regarding the above diagnoses ___________________________________________ Ihor Austinhomas J. Benjamin Stainhekkekandam, M.D., ABFM., CAQSM. Primary Care and Sports Medicine [facility]  Adjunct Instructor of Family Medicine  [employer]

## 2017-03-20 NOTE — Assessment & Plan Note (Signed)
Likely bicipital tendinitis. Continue with physical therapy, continues to improve. Again, if she has persistent pain at a 1 month follow-up we can push a little harder for her to start Celebrex, she has not done this yet.

## 2017-03-21 ENCOUNTER — Ambulatory Visit (INDEPENDENT_AMBULATORY_CARE_PROVIDER_SITE_OTHER): Payer: 59 | Admitting: Physical Therapy

## 2017-03-21 DIAGNOSIS — M25552 Pain in left hip: Secondary | ICD-10-CM | POA: Diagnosis not present

## 2017-03-21 DIAGNOSIS — M25551 Pain in right hip: Secondary | ICD-10-CM

## 2017-03-21 DIAGNOSIS — R29898 Other symptoms and signs involving the musculoskeletal system: Secondary | ICD-10-CM | POA: Diagnosis not present

## 2017-03-21 DIAGNOSIS — M79601 Pain in right arm: Secondary | ICD-10-CM | POA: Diagnosis not present

## 2017-03-21 NOTE — Therapy (Signed)
[facility] [address] [place], [place], [address] Phone: [phone]   Fax:  [phone]  Physical Therapy Treatment  Patient Details  Name: Julia Griffith MRN: [id number] Date of Birth: 02/12/1970 Referring Provider: Dr. Thekkekandam   Encounter Date: 03/21/2017  PT End of Session - 03/21/17 1337    Visit Number  14    Number of Visits  24    Date for PT Re-Evaluation  04/23/17    PT Start Time  0845    PT Stop Time  0943    PT Time Calculation (min)  58 min    Activity Tolerance  Patient tolerated treatment well    Behavior During Therapy  WFL for tasks assessed/performed       Past Medical History:  Diagnosis Date  . Osteoarthritis of right hip 10/19/2012  . Postpartum depression     Past Surgical History:  Procedure Laterality Date  . LEEP    . Right TM repair    . thumb and middle finger surgery     overuse injury    There were no vitals filed for this visit.  Subjective Assessment - 03/21/17 0854    Subjective  Pt reports she saw MD yesterday.  He extended therapy and will see her PRN after therapy.  She feels like her hips are off again. She'd like to continue therapy.      Patient Stated Goals  increase flexibility and ROM; decrease pain in hips and Rt biceps     Currently in Pain?  Yes    Pain Score  1     Pain Location  Hip    Pain Orientation  Right;Medial;Lateral    Aggravating Factors   sitting; some exercises    Pain Relieving Factors  stretching; massage; dry needling.          [facility] PT Assessment - 03/21/17 0001      Assessment   Medical Diagnosis  Bilat hip pain; Rt biceps pain     Referring Provider  Dr. Thekkekandam    Onset Date/Surgical Date  01/07/10 7 yrs hip pain     Hand Dominance  Right      Palpation   SI assessment   Rt ASIS lower than Lt. Rt sacral torsion.        [facility] Adult PT Treatment/Exercise - 03/21/17 0001      Knee/Hip Exercises: Stretches   Passive  Hamstring Stretch  Right;Left;30 seconds;3 reps    Hip Flexor Stretch  30 seconds;3 reps RLE; leg off of table    Other Knee/Hip Stretches  butterfly adductor stretch supine 30 sec x 2       Knee/Hip Exercises: Supine   Bridges  1 set;5 reps      Moist Heat Therapy   Number Minutes Moist Heat  20 Minutes    Moist Heat Location  Hip Rt posterior hip and inner thigh       Electrical Stimulation   Electrical Stimulation Location  Rt adductors/  Rt glute and piriformis.     Electrical Stimulation Action  premod to each area    Electrical Stimulation Parameters  to tolerance    Electrical Stimulation Goals  Tone;Pain      Ultrasound   Ultrasound Location  Rt prox adductors    Ultrasound Parameters  combo US, 100%, 1.2 w/cm2, 8 min     Ultrasound Goals  Pain tightness      Manual Therapy   Soft tissue mobilization  TPR   to Rt hip rotators, glute med and max (with contract/relax of each muscle)     Myofascial Release  Rt adductors (full length of muscle)     Muscle Energy Technique  MET to correct anteriorly rotated Rt ilium (in supine with hamstring contract/relax); MET to correct Rt sacral torsion in prone.                   PT Long Term Goals - 03/19/17 0801      PT LONG TERM GOAL #1   Title  Improve posture and alignment with patient to demonstrate good upright posture with hips extended and knees out of hyperextension. 04/23/17    Time  12    Status  Partially Met      PT LONG TERM GOAL #2   Title  Improve joint mobility and soft tissue extensibility in bilat LE's and trunk with patient demonstrating good joint ROM bilat hips 04/23/17    Time  12    Period  Weeks    Status  Partially Met      PT LONG TERM GOAL #3   Title  Patient reports 50-70% decrease in hip/LE pain 04/23/17    Time  12    Period  Weeks    Status  Partially Met 50% decrease in pain reported       PT LONG TERM GOAL #4   Title  Independent in HEP with focus on appropriate stretching 04/23/17     Time  12    Period  Weeks    Status  On-going      PT LONG TERM GOAL #5   Title  Improve FOTO to </= 30% limitation 04/23/17    Time  12    Period  Weeks    Status  On-going            Plan - 03/21/17 1333    Clinical Impression Statement  Pt's pelvis alignment somewhat symmetrical, only slightly off today. Pt having return of pain and tightness in prox Rt adductors; reduced with combo [place] and manual therapy.  Pt making gradual progress towards remaining goals.     Rehab Potential  Good    PT Frequency  2x / week    PT Duration  6 weeks    PT Treatment/Interventions  Patient/family education;ADLs/Self Care Home Management;Cryotherapy;Electrical Stimulation;Iontophoresis 61m/ml Dexamethasone;Moist Heat;Ultrasound;Dry needling;Manual techniques;Neuromuscular re-education;Therapeutic activities;Therapeutic exercise    PT Next Visit Plan  assess pelvis alignment as indicated; continue DN and manual therapy.  add pelvis stabilization exercises as appropriate.     Consulted and Agree with Plan of Care  Patient       Patient will benefit from skilled therapeutic intervention in order to improve the following deficits and impairments:  Postural dysfunction, Improper body mechanics, Pain, Increased muscle spasms, Impaired perceived functional ability, Hypomobility, Decreased mobility, Decreased range of motion, Decreased activity tolerance  Visit Diagnosis: Pain in right hip  Pain in left hip  Pain of right upper extremity  Other symptoms and signs involving the musculoskeletal system     Problem List Patient Active Problem List   Diagnosis Date Noted  . Primary osteoarthritis of both hips 01/20/2017  . Chronic right shoulder pain 01/20/2017  . Cough 08/19/2014  . Pertussis exposure 08/19/2014  . 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 ALLERGIC RHINITIS 05/29/2009  . BACK PAIN, THORACIC REGION 03/15/2009  . FOOT PAIN, LEFT 01/18/2009  . IRON DEFICIENCY 11/16/2007  . DIZZINESS  11/16/2007    JKerin Perna PTA 03/21/17  4:45 PM  [facility] [address] [place], , [address] Phone: [phone]   Fax:  [phone]  Name: Julia Griffith MRN: [id number] Date of Birth: 01/03/1971   

## 2017-03-26 ENCOUNTER — Ambulatory Visit (INDEPENDENT_AMBULATORY_CARE_PROVIDER_SITE_OTHER): Payer: 59 | Admitting: Rehabilitative and Restorative Service Providers"

## 2017-03-26 ENCOUNTER — Encounter: Payer: Self-pay | Admitting: Rehabilitative and Restorative Service Providers"

## 2017-03-26 DIAGNOSIS — M79601 Pain in right arm: Secondary | ICD-10-CM

## 2017-03-26 DIAGNOSIS — M25551 Pain in right hip: Secondary | ICD-10-CM

## 2017-03-26 DIAGNOSIS — R29898 Other symptoms and signs involving the musculoskeletal system: Secondary | ICD-10-CM | POA: Diagnosis not present

## 2017-03-26 DIAGNOSIS — M25552 Pain in left hip: Secondary | ICD-10-CM

## 2017-03-26 NOTE — Therapy (Signed)
[place], [place], [address] Phone: [phone]   Fax:  [phone]  Physical Therapy Treatment  Patient Details  Name: Julia Griffith MRN: [id number] Date of Birth: 08-12-1970 Referring Provider: Dr. Dianah Field   Encounter Date: 03/26/2017  PT End of Session - 03/26/17 0720    Visit Number  15    Number of Visits  24    Date for PT Re-Evaluation  04/23/17    PT Start Time  0719    PT Stop Time  0815    PT Time Calculation (min)  56 min    Activity Tolerance  Patient tolerated treatment well       Past Medical History:  Diagnosis Date  . Osteoarthritis of right hip 10/19/2012  . Postpartum depression     Past Surgical History:  Procedure Laterality Date  . LEEP    . Right TM repair    . thumb and middle finger surgery     overuse injury    There were no vitals filed for this visit.  Subjective Assessment - 03/26/17 0721    Subjective  Patient reports that she is doing well. Some joint pain Rt hip but otherwise doing well.     Currently in Pain?  Yes    Pain Score  1     Pain Location  Hip    Pain Orientation  Right    Pain Descriptors / Indicators  Aching    Pain Type  Chronic pain                      [facility] Adult PT Treatment/Exercise - 03/26/17 0001      Moist Heat Therapy   Number Minutes Moist Heat  20 Minutes    Moist Heat Location  Hip Rt posterior hip and inner thigh       Electrical Stimulation   Electrical Stimulation Location  Rt posterior hip/lateral thigh      Electrical Stimulation Action  IFC    Electrical Stimulation Parameters  to tolerance    Electrical Stimulation Goals  Tone;Pain      Manual Therapy   Manual therapy comments  pt in prone     Soft tissue mobilization  deep tissue work through posterior hip; lateral thigh; hamstrings     Myofascial Release  Rt adductors       Trigger Point Dry Needling - 03/26/17 0742    Consent  Given?  Yes    Muscles Treated Lower Body  -- Rt with estim     Gluteus Maximus Response  Palpable increased muscle length    Gluteus Minimus Response  Palpable increased muscle length    Piriformis Response  Palpable increased muscle length    Tensor Fascia Lata Response  Palpable increased muscle length    Hamstring Response  Palpable increased muscle length                PT Long Term Goals - 03/26/17 0720      PT LONG TERM GOAL #1   Title  Improve posture and alignment with patient to demonstrate good upright posture with hips extended and knees out of hyperextension. 04/23/17    Time  12    Period  Weeks    Status  Partially Met      PT LONG TERM GOAL #2   Title  Improve joint mobility and soft tissue extensibility in bilat LE's and trunk with patient demonstrating good joint  ROM bilat hips 04/23/17    Time  12    Period  Weeks    Status  Partially Met      PT LONG TERM GOAL #3   Title  Patient reports 50-70% decrease in hip/LE pain 04/23/17    Time  12    Period  Weeks    Status  Partially Met      PT LONG TERM GOAL #4   Title  Independent in HEP with focus on appropriate stretching 04/23/17    Time  12    Period  Weeks    Status  On-going      PT LONG TERM GOAL #5   Title  Improve FOTO to </= 30% limitation 04/23/17    Time  12    Period  Weeks    Status  On-going            Plan - 03/26/17 0744    Clinical Impression Statement  Continued tightness through the Rt posterior hip and lateral/posterior hip. Good response to DN and manual work with decreaed palpable tightness noted following treatment.     Rehab Potential  Good    PT Frequency  2x / week    PT Duration  6 weeks    PT Treatment/Interventions  Patient/family education;ADLs/Self Care Home Management;Cryotherapy;Electrical Stimulation;Iontophoresis 63m/ml Dexamethasone;Moist Heat;Ultrasound;Dry needling;Manual techniques;Neuromuscular re-education;Therapeutic activities;Therapeutic exercise     PT Next Visit Plan  assess pelvis alignment as indicated; continue DN and manual therapy.  add pelvis stabilization exercises as appropriate.     Consulted and Agree with Plan of Care  Patient       Patient will benefit from skilled therapeutic intervention in order to improve the following deficits and impairments:  Postural dysfunction, Improper body mechanics, Pain, Increased muscle spasms, Impaired perceived functional ability, Hypomobility, Decreased mobility, Decreased range of motion, Decreased activity tolerance  Visit Diagnosis: Pain in right hip  Pain in left hip  Pain of right upper extremity  Other symptoms and signs involving the musculoskeletal system     Problem List Patient Active Problem List   Diagnosis Date Noted  . Primary osteoarthritis of both hips 01/20/2017  . Chronic right shoulder pain 01/20/2017  . Cough 08/19/2014  . Pertussis exposure 08/19/2014  . 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 ALLERGIC RHINITIS 05/29/2009  . BACK PAIN, THORACIC REGION 03/15/2009  . FOOT PAIN, LEFT 01/18/2009  . IRON DEFICIENCY 11/16/2007  . DIZZINESS 11/16/2007    Julia Griffith PNilda SimmerPT, MPH  03/26/2017, 7:50 AM  [facility] [address]Phone: [phone]  Fax:  [phone] Name: Julia TatroMRN: [id number] of Birth: [birth year]

## 2017-03-28 ENCOUNTER — Ambulatory Visit (INDEPENDENT_AMBULATORY_CARE_PROVIDER_SITE_OTHER): Payer: 59 | Admitting: Physical Therapy

## 2017-03-28 DIAGNOSIS — M25551 Pain in right hip: Secondary | ICD-10-CM | POA: Diagnosis not present

## 2017-03-28 DIAGNOSIS — M25552 Pain in left hip: Secondary | ICD-10-CM

## 2017-03-28 DIAGNOSIS — R29898 Other symptoms and signs involving the musculoskeletal system: Secondary | ICD-10-CM

## 2017-03-28 NOTE — Therapy (Signed)
[place], [place], [address] Phone: [phone]   Fax:  [phone]  Physical Therapy Treatment  Patient Details  Name: Julia Griffith MRN: [id number] Date of Birth: Sep 14, 1970 Referring Provider: Dr. Dianah Field   Encounter Date: 03/28/2017  PT End of Session - 03/28/17 1108    Visit Number  16    Number of Visits  24    Date for PT Re-Evaluation  04/23/17    PT Start Time  1100    PT Stop Time  1200    PT Time Calculation (min)  60 min       Past Medical History:  Diagnosis Date  . Osteoarthritis of right hip 10/19/2012  . Postpartum depression     Past Surgical History:  Procedure Laterality Date  . LEEP    . Right TM repair    . thumb and middle finger surgery     overuse injury    There were no vitals filed for this visit.  Subjective Assessment - 03/28/17 1521    Subjective  Pt reports she has some tightness, however things are improving.  Limited pain now.  she has held off on doing ANY exercise, mostly because she is too busy with son's medical care.     Patient Stated Goals  increase flexibility and ROM; decrease pain in hips and Rt biceps     Currently in Pain?  No/denies    Pain Score  0-No pain just tightness    Pain Location  Hip    Pain Orientation  Right         [facility] PT Assessment - 03/28/17 0001      Assessment   Medical Diagnosis  Bilat hip pain; Rt biceps pain     Referring Provider  Dr. Dianah Field    Onset Date/Surgical Date  01/07/10 7 yrs hip pain     Hand Dominance  Right      AROM   Lumbar Flexion  WNL    Lumbar - Right Side Bend  WNL    Lumbar - Left Side Bend  80% tightness reported in Rt hip, pain    Lumbar - Right Rotation  50%    Lumbar - Left Rotation  50%      Palpation   SI assessment   ASIS and ilium ~ =        [facility] Adult PT Treatment/Exercise - 03/28/17 0001      Knee/Hip Exercises: Stretches   Passive Hamstring Stretch   Right;Left;30 seconds;3 reps    Hip Flexor Stretch  30 seconds;3 reps RLE; leg off of table    ITB Stretch  Right;Left;2 reps;30 seconds supine with strap     Piriformis Stretch  Right;Left;2 reps;30 seconds    Other Knee/Hip Stretches  butterfly adductor stretch supine 30 sec x 2       Knee/Hip Exercises: Supine   Bridges  1 set;5 reps      Moist Heat Therapy   Number Minutes Moist Heat  20 Minutes    Moist Heat Location  Hip Rt posterior hip and inner thigh       Electrical Stimulation   Electrical Stimulation Location  Rt posterior hip/QL/lateral thigh      Electrical Stimulation Action  IFC    Electrical Stimulation Parameters  to tolerance    Electrical Stimulation Goals  Tone;Pain      Manual Therapy   Soft tissue mobilization  TPR to Rt hip rotators, glute med  and max (with contract/relax of each muscle); Edge tool assistance to Rt QL and Rt lateral thigh to decrease fascial tightness. Pt reported increased discomfort with Edge tool and became more guarded; tool stopped and hands on work continued.     Myofascial Release  Rt lateral quad/ ITB/ glute med and min     Pt encouraged to begin gentle walking program and continue HEP of stretches for LE. Pt verbalized understanding.   PT Long Term Goals - 03/26/17 0720      PT LONG TERM GOAL #1   Title  Improve posture and alignment with patient to demonstrate good upright posture with hips extended and knees out of hyperextension. 04/23/17    Time  12    Period  Weeks    Status  Partially Met      PT LONG TERM GOAL #2   Title  Improve joint mobility and soft tissue extensibility in bilat LE's and trunk with patient demonstrating good joint ROM bilat hips 04/23/17    Time  12    Period  Weeks    Status  Partially Met      PT LONG TERM GOAL #3   Title  Patient reports 50-70% decrease in hip/LE pain 04/23/17    Time  12    Period  Weeks    Status  Partially Met      PT LONG TERM GOAL #4   Title  Independent in HEP with focus  on appropriate stretching 04/23/17    Time  12    Period  Weeks    Status  On-going      PT LONG TERM GOAL #5   Title  Improve FOTO to </= 30% limitation 04/23/17    Time  12    Period  Weeks    Status  On-going        Plan - 03/28/17 1513    Clinical Impression Statement  Pt demonstrated improved lumbar mobility.  She is reporting less Rt groin/ adductor pain and tightness.  Continued positive response to manual therapy and DN.  Her pelvis looked level today in standing, prone, and supine.  Pt had some fascial tightness and tenderness in Rt QL and proximal, lateral Rt quad; reduced slightly with manual therapy and MHP/estim.  Pt making good gains towards goals.     Rehab Potential  Good    PT Frequency  2x / week    PT Duration  6 weeks    PT Treatment/Interventions  Patient/family education;ADLs/Self Care Home Management;Cryotherapy;Electrical Stimulation;Iontophoresis 24m/ml Dexamethasone;Moist Heat;Ultrasound;Dry needling;Manual techniques;Neuromuscular re-education;Therapeutic activities;Therapeutic exercise    PT Next Visit Plan  assess pelvis alignment as indicated; continue DN and manual therapy.  add pelvis stabilization exercises as appropriate.     Consulted and Agree with Plan of Care  Patient       Patient will benefit from skilled therapeutic intervention in order to improve the following deficits and impairments:  Postural dysfunction, Improper body mechanics, Pain, Increased muscle spasms, Impaired perceived functional ability, Hypomobility, Decreased mobility, Decreased range of motion, Decreased activity tolerance  Visit Diagnosis: Pain in right hip  Pain in left hip  Other symptoms and signs involving the musculoskeletal system     Problem List Patient Active Problem List   Diagnosis Date Noted  . Primary osteoarthritis of both hips 01/20/2017  . Chronic right shoulder pain 01/20/2017  . Cough 08/19/2014  . Pertussis exposure 08/19/2014  . Osteoarthritis,  hand 10/19/2012  . ALLERGIC RHINITIS 05/29/2009  . BACK  PAIN, THORACIC REGION 03/15/2009  . FOOT PAIN, LEFT 01/18/2009  . IRON DEFICIENCY 11/16/2007  . DIZZINESS 11/16/2007   Kerin Perna, PTA 03/28/17 3:24 PM  [place], [place], [address] Phone: [phone]   Fax:  [phone]  Name: Julia Griffith MRN: [id number] Date of Birth: July 20, 1970

## 2017-04-02 ENCOUNTER — Ambulatory Visit (INDEPENDENT_AMBULATORY_CARE_PROVIDER_SITE_OTHER): Payer: 59 | Admitting: Rehabilitative and Restorative Service Providers"

## 2017-04-02 ENCOUNTER — Encounter: Payer: Self-pay | Admitting: Rehabilitative and Restorative Service Providers"

## 2017-04-02 DIAGNOSIS — R29898 Other symptoms and signs involving the musculoskeletal system: Secondary | ICD-10-CM

## 2017-04-02 DIAGNOSIS — M79601 Pain in right arm: Secondary | ICD-10-CM | POA: Diagnosis not present

## 2017-04-02 DIAGNOSIS — M25552 Pain in left hip: Secondary | ICD-10-CM | POA: Diagnosis not present

## 2017-04-02 DIAGNOSIS — M25551 Pain in right hip: Secondary | ICD-10-CM | POA: Diagnosis not present

## 2017-04-02 NOTE — Therapy (Signed)
[place], [place], [address] Phone: [phone]   Fax:  [phone]  Physical Therapy Treatment  Patient Details  Name: Julia Griffith MRN: [id number] Date of Birth: 30-Sep-1970 Referring Provider: Dr. Dianah Field   Encounter Date: 04/02/2017  PT End of Session - 04/02/17 0720    Visit Number  17    Number of Visits  24    Date for PT Re-Evaluation  04/23/17    PT Start Time  0715    PT Stop Time  0813    PT Time Calculation (min)  58 min    Activity Tolerance  Patient tolerated treatment well       Past Medical History:  Diagnosis Date  . Osteoarthritis of right hip 10/19/2012  . Postpartum depression     Past Surgical History:  Procedure Laterality Date  . LEEP    . Right TM repair    . thumb and middle finger surgery     overuse injury    There were no vitals filed for this visit.  Subjective Assessment - 04/02/17 0721    Subjective  Feeling "pretty good" - some tightness in the hip - more in the front of the hip     Currently in Pain?  Yes    Pain Score  1     Pain Location  Hip    Pain Orientation  Right    Pain Descriptors / Indicators  Tightness;Aching                No data recorded       [facility] Adult PT Treatment/Exercise - 04/02/17 0001      Knee/Hip Exercises: Stretches   Passive Hamstring Stretch  Right;Left;30 seconds;3 reps    Hip Flexor Stretch  30 seconds;3 reps RLE; leg off of table    ITB Stretch  Right;Left;2 reps;30 seconds supine with strap     Piriformis Stretch  Right;Left;2 reps;30 seconds    Other Knee/Hip Stretches  butterfly adductor stretch supine 30 sec x 2       Moist Heat Therapy   Number Minutes Moist Heat  20 Minutes    Moist Heat Location  Hip Rt posterior hip and inner thigh       Electrical Stimulation   Electrical Stimulation Location  Rt posterior hip/QL/lateral thigh      Electrical Stimulation Action  IFC     Electrical Stimulation Parameters  to tolerance    Electrical Stimulation Goals  Tone;Pain      Manual Therapy   Manual therapy comments  pt in prone and supine     Soft tissue mobilization  deep tissue work through musculature of Rt hip including hip flexors and posterior hip musculature     Myofascial Release  Rt posterior hip - piriformis and gluts        Trigger Point Dry Needling - 04/02/17 0743    Consent Given?  Yes    Muscles Treated Lower Body  -- Rt with estim     Gluteus Maximus Response  Palpable increased muscle length    Gluteus Minimus Response  Palpable increased muscle length    Piriformis Response  Palpable increased muscle length    Tensor Fascia Lata Response  Palpable increased muscle length    Quadriceps Response  Palpable increased muscle length    Adductor Response  Palpable increased muscle length                PT  Long Term Goals - 04/02/17 0720      PT LONG TERM GOAL #1   Title  Improve posture and alignment with patient to demonstrate good upright posture with hips extended and knees out of hyperextension. 04/23/17    Time  12    Period  Weeks    Status  Partially Met      PT LONG TERM GOAL #2   Title  Improve joint mobility and soft tissue extensibility in bilat LE's and trunk with patient demonstrating good joint ROM bilat hips 04/23/17    Time  12    Period  Weeks    Status  Partially Met      PT LONG TERM GOAL #3   Title  Patient reports 50-70% decrease in hip/LE pain 04/23/17    Time  12    Period  Weeks    Status  Partially Met      PT LONG TERM GOAL #4   Title  Independent in HEP with focus on appropriate stretching 04/23/17    Time  12    Period  Weeks    Status  On-going      PT LONG TERM GOAL #5   Title  Improve FOTO to </= 30% limitation 04/23/17    Time  12    Period  Weeks    Status  On-going            Plan - 04/02/17 0724    Clinical Impression Statement  Continued gradual improvement with less muscular  tightness and discomfort. Patient continues to have some adaptive muscular shortening and tightness. She responds well to DN and manual workl     Rehab Potential  Good    PT Frequency  2x / week    PT Duration  6 weeks    PT Treatment/Interventions  Patient/family education;ADLs/Self Care Home Management;Cryotherapy;Electrical Stimulation;Iontophoresis 25m/ml Dexamethasone;Moist Heat;Ultrasound;Dry needling;Manual techniques;Neuromuscular re-education;Therapeutic activities;Therapeutic exercise    PT Next Visit Plan  assess pelvis alignment as indicated; continue DN and manual therapy.  add pelvis stabilization exercises as appropriate.     Consulted and Agree with Plan of Care  Patient       Patient will benefit from skilled therapeutic intervention in order to improve the following deficits and impairments:  Postural dysfunction, Improper body mechanics, Pain, Increased muscle spasms, Impaired perceived functional ability, Hypomobility, Decreased mobility, Decreased range of motion, Decreased activity tolerance  Visit Diagnosis: Pain in right hip  Pain in left hip  Other symptoms and signs involving the musculoskeletal system  Pain of right upper extremity     Problem List Patient Active Problem List   Diagnosis Date Noted  . Primary osteoarthritis of both hips 01/20/2017  . Chronic right shoulder pain 01/20/2017  . Cough 08/19/2014  . Pertussis exposure 08/19/2014  . 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 ALLERGIC RHINITIS 05/29/2009  . BACK PAIN, THORACIC REGION 03/15/2009  . FOOT PAIN, LEFT 01/18/2009  . IRON DEFICIENCY 11/16/2007  . DIZZINESS 11/16/2007    Mussa Groesbeck PNilda SimmerPT, MPH  04/02/2017, 7:45 AM  [facility] [address]Phone: [phone]  Fax:  [phone] Name: Julia WardleMRN: [id number] of Birth: 1August 14, 1972

## 2017-04-04 ENCOUNTER — Ambulatory Visit (INDEPENDENT_AMBULATORY_CARE_PROVIDER_SITE_OTHER): Payer: 59 | Admitting: Physical Therapy

## 2017-04-04 DIAGNOSIS — M25552 Pain in left hip: Secondary | ICD-10-CM | POA: Diagnosis not present

## 2017-04-04 DIAGNOSIS — R29898 Other symptoms and signs involving the musculoskeletal system: Secondary | ICD-10-CM

## 2017-04-04 DIAGNOSIS — M79601 Pain in right arm: Secondary | ICD-10-CM | POA: Diagnosis not present

## 2017-04-04 DIAGNOSIS — M25551 Pain in right hip: Secondary | ICD-10-CM | POA: Diagnosis not present

## 2017-04-04 NOTE — Therapy (Signed)
[name], [place], [address] Phone: [phone]   Fax:  [phone]  Physical Therapy Treatment  Patient Details  Name: Julia Griffith MRN: [id number] Date of Birth: 01-25-1970 Referring Provider: Dr. Dianah Field   Encounter Date: 04/04/2017  PT End of Session - 04/04/17 0808    Visit Number  18    Number of Visits  24    Date for PT Re-Evaluation  04/23/17    PT Start Time  0805    PT Stop Time  0906    PT Time Calculation (min)  61 min    Activity Tolerance  Patient tolerated treatment well    Behavior During Therapy  [facility] for tasks assessed/performed       Past Medical History:  Diagnosis Date  . Osteoarthritis of right hip 10/19/2012  . Postpartum depression     Past Surgical History:  Procedure Laterality Date  . LEEP    . Right TM repair    . thumb and middle finger surgery     overuse injury    There were no vitals filed for this visit.  Subjective Assessment - 04/04/17 0808    Subjective  Pt reports she is still recooperating from DN last session.  She saw Dr. Sharyn Dross last week and he extended her orders for therapy.       Patient Stated Goals  increase flexibility and ROM; decrease pain in hips     Currently in Pain?  No/denies    Pain Score  2     Pain Location  Hip    Pain Orientation  Medial;Lateral;Right    Pain Descriptors / Indicators  Aching;Tightness    Aggravating Factors   prolonged sitting     Pain Relieving Factors  stretching; DN         [facility] PT Assessment - 04/04/17 0001      Assessment   Medical Diagnosis  Bilat hip pain; Rt biceps pain     Referring Provider  Dr. Dianah Field    Onset Date/Surgical Date  01/07/10    Hand Dominance  Right    Next MD Visit  PRN      Flexibility   Soft Tissue Assessment /Muscle Length  -- (-12) bilat hip flexors in [place] position      Palpation   SI assessment   Rt ASIS lower than Lt in supine;  Slight Rt  sacral torsion        [facility] Adult PT Treatment/Exercise - 04/04/17 0001      Knee/Hip Exercises: Stretches   Passive Hamstring Stretch  Right;Left;30 seconds;1 rep    Hip Flexor Stretch  30 seconds;3 reps;Limitations RLE; leg off of table    Hip Flexor Stretch Limitations  1 rep in prone with one leg off table    ITB Stretch  Right;Left;2 reps;30 seconds standing with legs crossed    Piriformis Stretch  Right;Left;2 reps;30 seconds      Knee/Hip Exercises: Supine   Bridges  Strengthening;Both;10 reps;1 set      Knee/Hip Exercises: Prone   Other Prone Exercises  opp arm / leg x 10 reps each side.       Moist Heat Therapy   Number Minutes Moist Heat  20 Minutes    Moist Heat Location  Hip  posterior hip and inner thigh       Electrical Stimulation   Electrical Stimulation Location  bilat TFL and prox adductor    Electrical Stimulation Action  premod  to each LE    Electrical Stimulation Parameters  to tolerance     Electrical Stimulation Goals  Tone;Pain      Manual Therapy   Soft tissue mobilization  TPR with contract relax of bilat TFL, Lt adductor.  STM to Lt adductors     Myofascial Release  MFR to Rt/Lt iliacus in supported supine.     Muscle Energy Technique  MET to correct anteriorly rotated Rt ilium (in supine with hamstring contract/relax); MET to correct Rt sacral torsion in prone.                   PT Long Term Goals - 04/04/17 0813      PT LONG TERM GOAL #1   Title  Improve posture and alignment with patient to demonstrate good upright posture with hips extended and knees out of hyperextension. 04/23/17    Time  12    Period  Weeks    Status  Partially Met      PT LONG TERM GOAL #2   Title  Improve joint mobility and soft tissue extensibility in bilat LE's and trunk with patient demonstrating good joint ROM bilat hips 04/23/17    Time  12    Period  Weeks    Status  Partially Met      PT LONG TERM GOAL #3   Title  Patient reports 50-70% decrease in  hip/LE pain 04/23/17    Status  Achieved      PT LONG TERM GOAL #4   Title  Independent in HEP with focus on appropriate stretching 04/23/17    Time  12    Period  Weeks      PT LONG TERM GOAL #5   Title  Improve FOTO to </= 30% limitation 04/23/17    Time  12    Period  Weeks    Status  On-going        Plan - 04/04/17 0816    Clinical Impression Statement  Pt has had continued improvement in Rt hip with DN and stretches.  She has some tightness in bilat hip flexors (-12 deg bilat in [place] position).  She has met LTG#3.  Time spent addressing Lt hip tightness today.    Rehab Potential  Good    PT Frequency  2x / week    PT Duration  6 weeks    PT Treatment/Interventions  Patient/family education;ADLs/Self Care Home Management;Cryotherapy;Electrical Stimulation;Iontophoresis 86m/ml Dexamethasone;Moist Heat;Ultrasound;Dry needling;Manual techniques;Neuromuscular re-education;Therapeutic activities;Therapeutic exercise    PT Next Visit Plan  assess pelvis alignment as indicated; continue DN and manual therapy.  add pelvis stabilization exercises as appropriate.        Patient will benefit from skilled therapeutic intervention in order to improve the following deficits and impairments:  Postural dysfunction, Improper body mechanics, Pain, Increased muscle spasms, Impaired perceived functional ability, Hypomobility, Decreased mobility, Decreased range of motion, Decreased activity tolerance  Visit Diagnosis: Pain in right hip  Pain in left hip  Other symptoms and signs involving the musculoskeletal system  Pain of right upper extremity     Problem List Patient Active Problem List   Diagnosis Date Noted  . Primary osteoarthritis of both hips 01/20/2017  . Chronic right shoulder pain 01/20/2017  . Cough 08/19/2014  . Pertussis exposure 08/19/2014  . 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 ALLERGIC RHINITIS 05/29/2009  . BACK PAIN, THORACIC REGION 03/15/2009  . FOOT PAIN, LEFT  01/18/2009  . IRON DEFICIENCY 11/16/2007  . DIZZINESS 11/16/2007  Kerin Perna, PTA 04/04/17 10:56 AM  [facility] [place], [place], [address] Phone: [phone]   Fax:  [phone]  Name: Julia Griffith MRN: [id number] Date of Birth: 08/28/1970

## 2017-04-08 ENCOUNTER — Ambulatory Visit (INDEPENDENT_AMBULATORY_CARE_PROVIDER_SITE_OTHER): Payer: 59 | Admitting: Physical Therapy

## 2017-04-08 DIAGNOSIS — M25552 Pain in left hip: Secondary | ICD-10-CM

## 2017-04-08 DIAGNOSIS — R29898 Other symptoms and signs involving the musculoskeletal system: Secondary | ICD-10-CM

## 2017-04-08 DIAGNOSIS — M79601 Pain in right arm: Secondary | ICD-10-CM

## 2017-04-08 DIAGNOSIS — M25551 Pain in right hip: Secondary | ICD-10-CM | POA: Diagnosis not present

## 2017-04-08 NOTE — Therapy (Addendum)
[place] [place], [place], [address] Phone: [phone]   Fax:  [phone]  Physical Therapy Treatment  Patient Details  Name: Julia Griffith MRN: [id number] Date of Birth: 22-Apr-1970 Referring Provider: Dr. Dianah Field   Encounter Date: 04/08/2017  PT End of Session - 04/08/17 0808    Visit Number  19    Number of Visits  24    Date for PT Re-Evaluation  04/23/17    PT Start Time  0805    PT Stop Time  0909    PT Time Calculation (min)  64 min    Activity Tolerance  Patient tolerated treatment well    Behavior During Therapy  [facility] for tasks assessed/performed       Past Medical History:  Diagnosis Date  . Osteoarthritis of right hip 10/19/2012  . Postpartum depression     Past Surgical History:  Procedure Laterality Date  . LEEP    . Right TM repair    . thumb and middle finger surgery     overuse injury    There were no vitals filed for this visit.  Subjective Assessment - 04/08/17 0809    Subjective  Pt reports no new changes since last visit.  She hasn't returned to working out or walking yet due to other obligations.     Patient Stated Goals  increase flexibility and ROM; decrease pain in hips     Currently in Pain?  Yes    Pain Score  1     Pain Location  Hip    Pain Orientation  Right    Pain Descriptors / Indicators  Dull    Aggravating Factors   prolonged sitting     Pain Relieving Factors  stretching; DN         [facility] PT Assessment - 04/08/17 0001      Assessment   Medical Diagnosis  Bilat hip pain; Rt biceps pain     Referring Provider  Dr. Dianah Field    Onset Date/Surgical Date  01/07/10    Hand Dominance  Right    Next MD Visit  PRN      Palpation   Spinal mobility  hypomobile L5, L4, CPA and Rt UPA. Hypomobile and tender Rt UPA L5-L2 performed by Jeral Pinch, PT    SI assessment   Rt ASIS lower than Lt in supine;  Rt sacral torsion; Rt ilium higher than Lt.           [place] Adult PT Treatment/Exercise - 04/08/17 0001      Lumbar Exercises: Quadruped   [place]/Old Horse  10 reps      Knee/Hip Exercises: Aerobic   Nustep  L5: 4.5 min, legs only.  PTA present to discuss progress       Moist Heat Therapy   Number Minutes Moist Heat  20 Minutes    Moist Heat Location  Lumbar Spine;Hip      Electrical Stimulation   Electrical Stimulation Location  Rt lumbar paraspinals; Rt groin    Electrical Stimulation Action  premod to each area    Electrical Stimulation Parameters  to tolerance    Electrical Stimulation Goals  Tone;Pain      Manual Therapy   Manual Therapy  Joint mobilization    Joint Mobilization  CPA mobs sacrum & lumbar - pain and hypomobile L5-3, and Rt UPA L5-1, hypomobile without pain Lt L3-1. Difficulty to perform mobs due to tightness in the lumbar paraspinals with guarding and  pain.     Soft tissue mobilization  STM through posterior Rt hip and low back.    Muscle Energy Technique  MET to correct elevated Rt ilium in Rt sidelying; MET to correct anteriorly rotated Rt ilium (in supine with hamstring contract/relax, 2 sets; then shown self corrected with RLE on elevated table); MET to correct Rt sacral torsion in prone.        Trigger Point Dry Needling - 04/08/17 3810    Consent Given?  Yes    Education Handout Provided  No    Muscles Treated Upper Body  Longissimus    Longissimus Response  Palpable increased muscle length;Twitch response elicited Rt F7-5 with stim - pt very tight                 PT Long Term Goals - 04/04/17 0813      PT LONG TERM GOAL #1   Title  Improve posture and alignment with patient to demonstrate good upright posture with hips extended and knees out of hyperextension. 04/23/17    Time  12    Period  Weeks    Status  Partially Met      PT LONG TERM GOAL #2   Title  Improve joint mobility and soft tissue extensibility in bilat LE's and trunk with patient demonstrating good joint ROM bilat  hips 04/23/17    Time  12    Period  Weeks    Status  Partially Met      PT LONG TERM GOAL #3   Title  Patient reports 50-70% decrease in hip/LE pain 04/23/17    Status  Achieved      PT LONG TERM GOAL #4   Title  Independent in HEP with focus on appropriate stretching 04/23/17    Time  12    Period  Weeks      PT LONG TERM GOAL #5   Title  Improve FOTO to </= 30% limitation 04/23/17    Time  12    Period  Weeks    Status  On-going            Plan - 04/08/17 0835    Clinical Impression Statement  Pt's pelvis alignment asymmetrical, similar pattern to previous one seen in weeks/months past.  Improved alignment with MET corrections.  Pt hypomobile and tender in L2-5, on Rt.  Pt making gradual progress towards remaining goals.      Rehab Potential  Good    PT Frequency  2x / week    PT Duration  6 weeks    PT Treatment/Interventions  Patient/family education;ADLs/Self Care Home Management;Cryotherapy;Electrical Stimulation;Iontophoresis 43m/ml Dexamethasone;Moist Heat;Ultrasound;Dry needling;Manual techniques;Neuromuscular re-education;Therapeutic activities;Therapeutic exercise    PT Next Visit Plan  assess pelvis alignment as indicated; continue DN and manual therapy.  add pelvis stabilization exercises as appropriate.     Consulted and Agree with Plan of Care  Patient       Patient will benefit from skilled therapeutic intervention in order to improve the following deficits and impairments:  Postural dysfunction, Improper body mechanics, Pain, Increased muscle spasms, Impaired perceived functional ability, Hypomobility, Decreased mobility, Decreased range of motion, Decreased activity tolerance  Visit Diagnosis: Pain in right hip  Pain in left hip  Other symptoms and signs involving the musculoskeletal system  Pain of right upper extremity     Problem List Patient Active Problem List   Diagnosis Date Noted  . Primary osteoarthritis of both hips 01/20/2017  .  Chronic right shoulder pain  01/20/2017  . Cough 08/19/2014  . Pertussis exposure 08/19/2014  . 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 ALLERGIC RHINITIS 05/29/2009  . BACK PAIN, THORACIC REGION 03/15/2009  . FOOT PAIN, LEFT 01/18/2009  . IRON DEFICIENCY 11/16/2007  . DIZZINESS 11/16/2007   Kerin Perna, PTA 04/08/17 1:39 PM   Jeral Pinch, PT 04/09/17 7:27 AM   [facility] [place], [place], [address] Phone: [phone]   Fax:  [phone]  Name: Julia Griffith MRN: [id number] Date of Birth: 11-Mar-1970

## 2017-04-10 ENCOUNTER — Encounter: Payer: Self-pay | Admitting: Rehabilitative and Restorative Service Providers"

## 2017-04-10 ENCOUNTER — Ambulatory Visit (INDEPENDENT_AMBULATORY_CARE_PROVIDER_SITE_OTHER): Payer: 59 | Admitting: Rehabilitative and Restorative Service Providers"

## 2017-04-10 DIAGNOSIS — M25552 Pain in left hip: Secondary | ICD-10-CM | POA: Diagnosis not present

## 2017-04-10 DIAGNOSIS — R29898 Other symptoms and signs involving the musculoskeletal system: Secondary | ICD-10-CM | POA: Diagnosis not present

## 2017-04-10 DIAGNOSIS — M25551 Pain in right hip: Secondary | ICD-10-CM

## 2017-04-10 DIAGNOSIS — M79601 Pain in right arm: Secondary | ICD-10-CM

## 2017-04-10 NOTE — Therapy (Signed)
[place], [place], [address] Phone: [phone]   Fax:  [phone]  Physical Therapy Treatment  Patient Details  Name: Julia Griffith MRN: [id number] Date of Birth: 20-Nov-1970 Referring Provider: Dr. Dianah Field   Encounter Date: 04/10/2017  PT End of Session - 04/10/17 0804    Visit Number  20    Number of Visits  24    Date for PT Re-Evaluation  04/23/17    PT Start Time  0800    PT Stop Time  0858    PT Time Calculation (min)  58 min    Activity Tolerance  Patient tolerated treatment well       Past Medical History:  Diagnosis Date  . Osteoarthritis of right hip 10/19/2012  . Postpartum depression     Past Surgical History:  Procedure Laterality Date  . LEEP    . Right TM repair    . thumb and middle finger surgery     overuse injury    There were no vitals filed for this visit.  Subjective Assessment - 04/10/17 0804    Subjective  sore from needliing. Feels the tight, catching spots are front of the Rt hip and back. Sometimes feels she is catching even moving her Rt foot from the gas to break when driving. Overall continues to improve. Doing well. 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     Currently in Pain?  Yes    Pain Score  2     Pain Location  Hip    Pain Orientation  Right;Anterior    Pain Descriptors / Indicators  Tightness    Pain Type  Chronic pain                       [facility] Adult PT Treatment/Exercise - 04/10/17 0001      Knee/Hip Exercises: Stretches   Passive Hamstring Stretch  Right;Left;30 seconds;1 rep    Sports administrator  Right;2 reps;20 seconds added foam roll distal thigh     Hip Flexor Stretch  30 seconds;3 reps;Limitations      Moist Heat Therapy   Number Minutes Moist Heat  20 Minutes    Moist Heat Location  Lumbar Spine;Hip      Electrical Stimulation   Electrical Stimulation Location  Rt anterior/posterior Rt hip     Electrical Stimulation Action  premod     Electrical Stimulation Parameters  to tolerance    Electrical Stimulation Goals  Tone;Pain      Manual Therapy   Soft tissue mobilization  working through the anterior hip/quads/sartrosis/quads and posteriorly through the piriformis/gluts/proximal hamstrings     Myofascial Release  anterior hip/posterior hip                   PT Long Term Goals - 04/04/17 0813      PT LONG TERM GOAL #1   Title  Improve posture and alignment with patient to demonstrate good upright posture with hips extended and knees out of hyperextension. 04/23/17    Time  12    Period  Weeks    Status  Partially Met      PT LONG TERM GOAL #2   Title  Improve joint mobility and soft tissue extensibility in bilat LE's and trunk with patient demonstrating good joint ROM bilat hips 04/23/17    Time  12    Period  Weeks    Status  Partially Met  PT LONG TERM GOAL #3   Title  Patient reports 50-70% decrease in hip/LE pain 04/23/17    Status  Achieved      PT LONG TERM GOAL #4   Title  Independent in HEP with focus on appropriate stretching 04/23/17    Time  12    Period  Weeks      PT LONG TERM GOAL #5   Title  Improve FOTO to </= 30% limitation 04/23/17    Time  12    Period  Weeks    Status  On-going            Plan - 04/10/17 7893    Clinical Impression Statement  sore following DN to lumbar spine but decresed tightness reported and noted. Patient continues to have tightness anterior and posterior Rt hip which was addressed with DN today. Good response to treatment of DN, manual work, stretching and modalities. Progressing well toward stated goals of therapy.     Rehab Potential  Good    PT Frequency  2x / week    PT Duration  6 weeks    PT Treatment/Interventions  Patient/family education;ADLs/Self Care Home Management;Cryotherapy;Electrical Stimulation;Iontophoresis 78m/ml Dexamethasone;Moist Heat;Ultrasound;Dry needling;Manual  techniques;Neuromuscular re-education;Therapeutic activities;Therapeutic exercise    PT Next Visit Plan  assess pelvis alignment as indicated; continue DN and manual therapy.  add pelvis stabilization exercises as appropriate.     Consulted and Agree with Plan of Care  Patient       Patient will benefit from skilled therapeutic intervention in order to improve the following deficits and impairments:  Postural dysfunction, Improper body mechanics, Pain, Increased muscle spasms, Impaired perceived functional ability, Hypomobility, Decreased mobility, Decreased range of motion, Decreased activity tolerance  Visit Diagnosis: Pain in right hip  Pain in left hip  Other symptoms and signs involving the musculoskeletal system  Pain of right upper extremity     Problem List Patient Active Problem List   Diagnosis Date Noted  . Primary osteoarthritis of both hips 01/20/2017  . Chronic right shoulder pain 01/20/2017  . Cough 08/19/2014  . Pertussis exposure 08/19/2014  . 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 ALLERGIC RHINITIS 05/29/2009  . BACK PAIN, THORACIC REGION 03/15/2009  . FOOT PAIN, LEFT 01/18/2009  . IRON DEFICIENCY 11/16/2007  . DIZZINESS 11/16/2007    Nathanyl Andujo PNilda SimmerPT, MPH  04/10/2017, 8:50 AM  [facility] [address]Phone: [phone]  Fax:  [phone] Name: Julia BlankeMRN: [id number] of Birth: 113-Dec-1972

## 2017-04-15 ENCOUNTER — Ambulatory Visit (INDEPENDENT_AMBULATORY_CARE_PROVIDER_SITE_OTHER): Payer: 59 | Admitting: Physical Therapy

## 2017-04-15 DIAGNOSIS — M25551 Pain in right hip: Secondary | ICD-10-CM | POA: Diagnosis not present

## 2017-04-15 DIAGNOSIS — R29898 Other symptoms and signs involving the musculoskeletal system: Secondary | ICD-10-CM

## 2017-04-15 NOTE — Therapy (Signed)
[place], [place], [address] Phone: [phone]   Fax:  [phone]  Physical Therapy Treatment  Patient Details  Name: Julia Griffith MRN: [id number] Date of Birth: 1970/12/17 Referring Provider: Dr. Dianah Field    Encounter Date: 04/15/2017  PT End of Session - 04/15/17 0807    Visit Number  21    Number of Visits  24    Date for PT Re-Evaluation  04/23/17    PT Start Time  0803    PT Stop Time  6720    PT Time Calculation (min)  52 min    Activity Tolerance  Patient tolerated treatment well;No increased pain    Behavior During Therapy  WFL for tasks assessed/performed       Past Medical History:  Diagnosis Date  . Osteoarthritis of right hip 10/19/2012  . Postpartum depression     Past Surgical History:  Procedure Laterality Date  . LEEP    . Right TM repair    . thumb and middle finger surgery     overuse injury    There were no vitals filed for this visit.  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 Overall things are going well.    Currently in Pain?  No/denies just tightness in Rt inner thigh.     Pain Score  0-No pain         [facility] PT Assessment - 04/15/17 0001      Assessment   Medical Diagnosis  Bilat hip pain; Rt biceps pain     Referring Provider  Dr. Dianah Field     Onset Date/Surgical Date  01/07/10    Hand Dominance  Right    Next MD Visit  PRN      Palpation   SI assessment   ASIS and PSIS =; sacrum level in prone.  ilium = in standing.     Palpation comment  point tender adductor longus and gracilus (prox and distally)        [facility] Adult PT Treatment/Exercise - 04/15/17 0001      Lumbar Exercises: Stretches   Other Lumbar Stretch Exercise  standing adductor stretch (side lunge position) 3 reps each leg for 30 seconds.       Moist  Heat Therapy   Number Minutes Moist Heat  20 Minutes    Moist Heat Location  Hip Rt inner and outer hip      Electrical Stimulation   Electrical Stimulation Location  Rt adductors/  Rt glute and piriformis.     Electrical Stimulation Action  premod to each area    Printmaker Parameters  to tolerance     Electrical Stimulation Goals  Tone;Pain      Ultrasound   Ultrasound Location  Rt prox and distal adductors    Ultrasound Parameters  100%, 1.3 w/cm2, 8 min     Ultrasound Goals  Pain      Manual Therapy   Manual therapy comments  pt in Lt sidelying and supported supine positon    Soft tissue mobilization  STM and TPR with contract relax to Rt adductor longus, gracilus, piriformis, glute med    Myofascial Release  MFR to Rt adductor group and Rt glute medius           PT Long Term Goals - 04/04/17 9470  PT LONG TERM GOAL #1   Title  Improve posture and alignment with patient to demonstrate good upright posture with hips extended and knees out of hyperextension. 04/23/17    Time  12    Period  Weeks    Status  Partially Met      PT LONG TERM GOAL #2   Title  Improve joint mobility and soft tissue extensibility in bilat LE's and trunk with patient demonstrating good joint ROM bilat hips 04/23/17    Time  12    Period  Weeks    Status  Partially Met      PT LONG TERM GOAL #3   Title  Patient reports 50-70% decrease in hip/LE pain 04/23/17    Status  Achieved      PT LONG TERM GOAL #4   Title  Independent in HEP with focus on appropriate stretching 04/23/17    Time  12    Period  Weeks      PT LONG TERM GOAL #5   Title  Improve FOTO to </= 30% limitation 04/23/17    Time  12    Period  Weeks    Status  On-going            Plan - 04/15/17 0914    Clinical Impression Statement  Pt had palpable tightness and tenderness along Rt adductor group, both prox and distally; reduced with manual therapy and therapeutic [place]. Pt also point tender in Rt gluteus  medius and piriformis origin.  Pt's pelvic alignment symmetrical today.  Pt reporting improved functional mobility in community and home.      Rehab Potential  Good    PT Frequency  2x / week    PT Duration  6 weeks    PT Treatment/Interventions  Patient/family education;ADLs/Self Care Home Management;Cryotherapy;Electrical Stimulation;Iontophoresis 6m/ml Dexamethasone;Moist Heat;Ultrasound;Dry needling;Manual techniques;Neuromuscular re-education;Therapeutic activities;Therapeutic exercise    PT Next Visit Plan  assess pelvis alignment as indicated; continue DN and manual therapy.      Consulted and Agree with Plan of Care  Patient       Patient will benefit from skilled therapeutic intervention in order to improve the following deficits and impairments:  Postural dysfunction, Improper body mechanics, Pain, Increased muscle spasms, Impaired perceived functional ability, Hypomobility, Decreased mobility, Decreased range of motion, Decreased activity tolerance  Visit Diagnosis: Pain in right hip  Other symptoms and signs involving the musculoskeletal system     Problem List Patient Active Problem List   Diagnosis Date Noted  . Primary osteoarthritis of both hips 01/20/2017  . Chronic right shoulder pain 01/20/2017  . Cough 08/19/2014  . Pertussis exposure 08/19/2014  . 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 ALLERGIC RHINITIS 05/29/2009  . BACK PAIN, THORACIC REGION 03/15/2009  . FOOT PAIN, LEFT 01/18/2009  . IRON DEFICIENCY 11/16/2007  . DIZZINESS 11/16/2007   JKerin Perna PTA 04/15/17 9:26 AM  [place] [address]Phone: [phone]  Fax:  [phone] Name: TSkylyn SlezakMRN: [id number] of Birth: [birth year]

## 2017-04-17 ENCOUNTER — Encounter: Payer: Self-pay | Admitting: Rehabilitative and Restorative Service Providers"

## 2017-04-17 ENCOUNTER — Ambulatory Visit (INDEPENDENT_AMBULATORY_CARE_PROVIDER_SITE_OTHER): Payer: 59 | Admitting: Rehabilitative and Restorative Service Providers"

## 2017-04-17 DIAGNOSIS — M79601 Pain in right arm: Secondary | ICD-10-CM

## 2017-04-17 DIAGNOSIS — R29898 Other symptoms and signs involving the musculoskeletal system: Secondary | ICD-10-CM

## 2017-04-17 DIAGNOSIS — M25552 Pain in left hip: Secondary | ICD-10-CM

## 2017-04-17 DIAGNOSIS — M25551 Pain in right hip: Secondary | ICD-10-CM | POA: Diagnosis not present

## 2017-04-17 NOTE — Therapy (Signed)
[place], [place], [address] Phone: [phone]   Fax:  [phone]  Physical Therapy Treatment  Patient Details  Name: Julia Griffith MRN: [id number] Date of Birth: 1970/06/21 Referring Provider: Dr. Dianah Field    Encounter Date: 04/17/2017  PT End of Session - 04/17/17 0806    Visit Number  22    Number of Visits  24    Date for PT Re-Evaluation  04/23/17    PT Start Time  0800    PT Stop Time  0855    PT Time Calculation (min)  55 min    Activity Tolerance  Patient tolerated treatment well       Past Medical History:  Diagnosis Date  . Osteoarthritis of right hip 10/19/2012  . Postpartum depression     Past Surgical History:  Procedure Laterality Date  . LEEP    . Right TM repair    . thumb and middle finger surgery     overuse injury    There were no vitals filed for this visit.  Subjective Assessment - 04/17/17 0808    Subjective  Some catching pain in the anterior Rt hip. Not too bad today. Has already done her stretching today.     Currently in Pain?  Yes    Pain Score  1     Pain Location  Hip    Pain Orientation  Right;Anterior    Pain Descriptors / Indicators  Tightness    Pain Type  Chronic pain                       [facility] Adult PT Treatment/Exercise - 04/17/17 0001      Moist Heat Therapy   Number Minutes Moist Heat  20 Minutes    Moist Heat Location  Hip Rt inner and outer hip      Electrical Stimulation   Electrical Stimulation Location  Rt adductors/  Rt glute and piriformis.     Electrical Stimulation Action  IFC    Electrical Stimulation Parameters  to tolerance    Electrical Stimulation Goals  Tone;Pain      Manual Therapy   Manual therapy comments  pt supine     Joint Mobilization  Rt hip AP mobs    Soft tissue mobilization  deep tissue work through the [place]  hip adductors; hip flexors; quads     Myofascial Release  Rt  adductor group and Rt glut medius        Trigger Point Dry Needling - 04/17/17 0829    Consent Given?  Yes    Tensor Fascia Lata Response  Palpable increased muscle length    Quadriceps Response  Palpable increased muscle length    Adductor Response  Palpable increased muscle length                PT Long Term Goals - 04/17/17 0807      PT LONG TERM GOAL #1   Title  Improve posture and alignment with patient to demonstrate good upright posture with hips extended and knees out of hyperextension. 04/23/17    Time  12    Period  Weeks    Status  Partially Met      PT LONG TERM GOAL #2   Title  Improve joint mobility and soft tissue extensibility in bilat LE's and trunk with patient demonstrating good joint ROM bilat hips 04/23/17    Time  12  Period  Weeks    Status  Partially Met      PT LONG TERM GOAL #3   Title  Patient reports 50-70% decrease in hip/LE pain 04/23/17    Time  12    Period  Weeks    Status  Achieved      PT LONG TERM GOAL #4   Title  Independent in HEP with focus on appropriate stretching 04/23/17    Time  12    Period  Weeks    Status  On-going      PT LONG TERM GOAL #5   Title  Improve FOTO to </= 30% limitation 04/23/17    Time  12    Period  Weeks    Status  On-going            Plan - 04/17/17 0830    Clinical Impression Statement  Continued progress with increasing activity level and decreasing pain. She has palpable tightness through the Rt hip adductors and quads/hip flexors. Patient responds well to DN and manual work. She continue to progress toward goals of therapy.     Rehab Potential  Good    PT Frequency  2x / week    PT Duration  6 weeks    PT Treatment/Interventions  Patient/family education;ADLs/Self Care Home Management;Cryotherapy;Electrical Stimulation;Iontophoresis 51m/ml Dexamethasone;Moist Heat;Ultrasound;Dry needling;Manual techniques;Neuromuscular re-education;Therapeutic activities;Therapeutic exercise    PT Next  Visit Plan  assess pelvis alignment as indicated; continue DN and manual therapy.      Consulted and Agree with Plan of Care  Patient       Patient will benefit from skilled therapeutic intervention in order to improve the following deficits and impairments:  Postural dysfunction, Improper body mechanics, Pain, Increased muscle spasms, Impaired perceived functional ability, Hypomobility, Decreased mobility, Decreased range of motion, Decreased activity tolerance  Visit Diagnosis: Pain in right hip  Other symptoms and signs involving the musculoskeletal system  Pain in left hip  Pain of right upper extremity     Problem List Patient Active Problem List   Diagnosis Date Noted  . Primary osteoarthritis of both hips 01/20/2017  . Chronic right shoulder pain 01/20/2017  . Cough 08/19/2014  . Pertussis exposure 08/19/2014  . 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 ALLERGIC RHINITIS 05/29/2009  . BACK PAIN, THORACIC REGION 03/15/2009  . FOOT PAIN, LEFT 01/18/2009  . IRON DEFICIENCY 11/16/2007  . DIZZINESS 11/16/2007    Emery Dupuy PNilda SimmerPT, MPH  04/17/2017, 8:43 AM  [facility] [address]Phone: [phone]  Fax:  [phone] Name: TDanna SewellMRN: [id number] of Birth: [birth year]

## 2017-04-23 ENCOUNTER — Ambulatory Visit (INDEPENDENT_AMBULATORY_CARE_PROVIDER_SITE_OTHER): Payer: 59 | Admitting: Rehabilitative and Restorative Service Providers"

## 2017-04-23 ENCOUNTER — Encounter: Payer: Self-pay | Admitting: Rehabilitative and Restorative Service Providers"

## 2017-04-23 DIAGNOSIS — M25551 Pain in right hip: Secondary | ICD-10-CM | POA: Diagnosis not present

## 2017-04-23 DIAGNOSIS — M79601 Pain in right arm: Secondary | ICD-10-CM

## 2017-04-23 DIAGNOSIS — R29898 Other symptoms and signs involving the musculoskeletal system: Secondary | ICD-10-CM

## 2017-04-23 DIAGNOSIS — M25552 Pain in left hip: Secondary | ICD-10-CM

## 2017-04-23 NOTE — Therapy (Signed)
[facility] [place], [place], [address] Phone: [phone]   Fax:  [phone]  Physical Therapy Treatment  Patient Details  Name: Julia Griffith MRN: [id number] Date of Birth: Oct 06, 1970 Referring Provider: Dr. Dianah Field    Encounter Date: 04/23/2017    Past Medical History:  Diagnosis Date  . Osteoarthritis of right hip 10/19/2012  . Postpartum depression     Past Surgical History:  Procedure Laterality Date  . LEEP    . Right TM repair    . thumb and middle finger surgery     overuse injury    There were no vitals filed for this visit.  Subjective Assessment - 04/23/17 1026    Subjective  Some tightness in the ineer and outer thigh on Rt. Still stretching but has not been doing the hips flexor stretch.     Currently in Pain?  Yes    Pain Score  1     Pain Location  Hip    Pain Orientation  Right;Medial    Pain Descriptors / Indicators  Tightness    Pain Type  Chronic pain    Pain Radiating Towards  inner thigh     Pain Onset  More than a month ago    Pain Frequency  Intermittent                       [facility] Adult PT Treatment/Exercise - 04/23/17 0001      Lumbar Exercises: Stretches   Hip Flexor Stretch  3 reps;30 seconds thomas supine     Other Lumbar Stretch Exercise  adductor stretch with PT assist 30 sec x 3; sartorius stretch with PT assist 30-45 sec x 3      Knee/Hip Exercises: Stretches   Other Knee/Hip Stretches  adductor stretch frog leg supine       Moist Heat Therapy   Number Minutes Moist Heat  20 Minutes    Moist Heat Location  Hip Rt inner and outer hip; posas       Electrical Stimulation   Electrical Stimulation Location  Rt adductors; Rt lateral thigh     Electrical Stimulation Action  IFC    Electrical Stimulation Parameters  to tolerance    Electrical Stimulation Goals  Tone;Pain      Manual Therapy   Manual therapy comments  pt supine     Soft tissue mobilization  deep tissue work through the [place]  hip psoas; adductors; hip flexors; quads     Myofascial Release  Rt adductors; Rt quad; Rt lateral thigh                   PT Long Term Goals - 04/23/17 1022      PT LONG TERM GOAL #1   Title  Improve posture and alignment with patient to demonstrate good upright posture with hips extended and knees out of hyperextension. 04/23/17    Time  12    Period  Weeks    Status  Partially Met      PT LONG TERM GOAL #2   Title  Improve joint mobility and soft tissue extensibility in bilat LE's and trunk with patient demonstrating good joint ROM bilat hips 04/23/17    Time  12    Period  Weeks    Status  Partially Met      PT LONG TERM GOAL #3   Title  Patient reports 50-70% decrease in hip/LE pain  04/23/17    Time  12    Period  Weeks    Status  Achieved      PT LONG TERM GOAL #4   Title  Independent in HEP with focus on appropriate stretching 04/23/17    Time  12    Period  Weeks    Status  On-going      PT LONG TERM GOAL #5   Title  Improve FOTO to </= 30% limitation 04/23/17    Time  12    Period  Weeks    Status  On-going            Plan - 04/23/17 1024    Clinical Impression Statement  Continued tightness in the inner and outer thigh on the Rt. Has stopped doing the hip flexor stretch which is likely contributing to increased tightness in the hip adductorrs as she is more flexed forward. Very sensitive and tight through the Rt psoas, adductore, quads, hip flexors, ITB.  Good response to DN and manual work.     Rehab Potential  Good    PT Frequency  2x / week    PT Duration  6 weeks    PT Treatment/Interventions  Patient/family education;ADLs/Self Care Home Management;Cryotherapy;Electrical Stimulation;Iontophoresis 77m/ml Dexamethasone;Moist Heat;Ultrasound;Dry needling;Manual techniques;Neuromuscular re-education;Therapeutic activities;Therapeutic exercise    PT Next Visit Plan  assess pelvis  alignment as indicated; continue DN and manual therapy.      Consulted and Agree with Plan of Care  Patient       Patient will benefit from skilled therapeutic intervention in order to improve the following deficits and impairments:  Postural dysfunction, Improper body mechanics, Pain, Increased muscle spasms, Impaired perceived functional ability, Hypomobility, Decreased mobility, Decreased range of motion, Decreased activity tolerance  Visit Diagnosis: Pain in right hip  Other symptoms and signs involving the musculoskeletal system  Pain in left hip  Pain of right upper extremity     Problem List Patient Active Problem List   Diagnosis Date Noted  . Primary osteoarthritis of both hips 01/20/2017  . Chronic right shoulder pain 01/20/2017  . Cough 08/19/2014  . Pertussis exposure 08/19/2014  . 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 ALLERGIC RHINITIS 05/29/2009  . BACK PAIN, THORACIC REGION 03/15/2009  . FOOT PAIN, LEFT 01/18/2009  . IRON DEFICIENCY 11/16/2007  . DIZZINESS 11/16/2007    Julia Griffith PNilda SimmerPT, MPH  04/23/2017, 12:24 PM  [facility] [address]Phone: [phone]  Fax:  [phone] Name: Julia OrtloffMRN: [id number] of Birth: 107/13/1972

## 2017-04-28 ENCOUNTER — Encounter: Payer: Self-pay | Admitting: Rehabilitative and Restorative Service Providers"

## 2017-04-28 ENCOUNTER — Ambulatory Visit (INDEPENDENT_AMBULATORY_CARE_PROVIDER_SITE_OTHER): Payer: 59 | Admitting: Rehabilitative and Restorative Service Providers"

## 2017-04-28 DIAGNOSIS — M25552 Pain in left hip: Secondary | ICD-10-CM | POA: Diagnosis not present

## 2017-04-28 DIAGNOSIS — M25551 Pain in right hip: Secondary | ICD-10-CM | POA: Diagnosis not present

## 2017-04-28 DIAGNOSIS — M79601 Pain in right arm: Secondary | ICD-10-CM | POA: Diagnosis not present

## 2017-04-28 DIAGNOSIS — R29898 Other symptoms and signs involving the musculoskeletal system: Secondary | ICD-10-CM | POA: Diagnosis not present

## 2017-04-28 NOTE — Therapy (Signed)
[facility] [address] [place], [place], [address] Phone: [phone]   Fax:  [phone]  Physical Therapy Evaluation  Patient Details  Name: Julia Griffith MRN: [id number] Date of Birth: 07/21/1970 Referring Provider: Dr Benjamin Stainhekkekandam   Encounter Date: 04/28/2017  PT End of Session - 04/28/17 0847    Visit Number  22    Number of Visits  32    Date for PT Re-Evaluation  06/23/17    PT Start Time  0845    PT Stop Time  0943    PT Time Calculation (min)  58 min    Activity Tolerance  Patient tolerated treatment well       Past Medical History:  Diagnosis Date  . Osteoarthritis of right hip 10/19/2012  . Postpartum depression     Past Surgical History:  Procedure Laterality Date  . LEEP    . Right TM repair    . thumb and middle finger surgery     overuse injury    There were no vitals filed for this visit.   Subjective Assessment - 04/28/17 0900    Subjective  Improving tightness with manual work at last visit and stretching     Currently in Pain?  Yes    Pain Score  1     Pain Location  Hip    Pain Orientation  Right;Anterior    Pain Descriptors / Indicators  Tightness    Pain Type  Chronic pain    Pain Onset  More than a month ago    Pain Frequency  Intermittent    Aggravating Factors   prolonged sitting; running    Pain Relieving Factors  stretching; XB:MWUXLK:manual work ; TENS          [facility] PT Assessment - 04/28/17 0001      Assessment   Medical Diagnosis  Bilat hip pain; Rt biceps pain     Referring Provider  Dr Benjamin Stainhekkekandam    Onset Date/Surgical Date  01/07/10    Hand Dominance  Right    Next MD Visit  PRN      Observation/Other Assessments   Focus on Therapeutic Outcomes (FOTO)   40% limitation       Posture/Postural Control   Posture Comments  head forward shoulders rounded and elevated; head of the humerus anterior; scapulae abducted; flexed fwd at hips; knees hyperextended       AROM   Lumbar Flexion  WNL    Lumbar Extension  35%    Lumbar - Right Side Bend  WNL    Lumbar - Left Side Bend  80%    Lumbar - Right Rotation  55%    Lumbar - Left Rotation  55%      Flexibility   Hamstrings  WFL's    Quadriceps  tight bilat     ITB  minimal tightness     Piriformis  tightness Rt > Lt       Palpation   Palpation comment  tenderness adductor longus and gracilus (prox and distally); hip flexors; sartorius                 Objective measurements completed on examination: See above findings.      [facility] Adult PT Treatment/Exercise - 04/28/17 0001      Elbow Exercises   Elbow Flexion  -- 3 part core 10 sec x 10 reps       Lumbar Exercises: Stretches   Hip Flexor Stretch  3 reps;30  seconds thomas supine     Other Lumbar Stretch Exercise  adductor stretch with PT assist 30 sec x 3; sartorius stretch with PT assist 30-45 sec x 3      Knee/Hip Exercises: Stretches   Piriformis Stretch  Right;Left;2 reps;30 seconds    Other Knee/Hip Stretches  adductor stretch frog leg supine       Moist Heat Therapy   Number Minutes Moist Heat  20 Minutes    Moist Heat Location  Hip Rt inner and outer hip; posas       Electrical Stimulation   Electrical Stimulation Location  Rt adductors; Rt lateral thigh     Electrical Stimulation Action  IFC     Electrical Stimulation Parameters  to tolerance     Electrical Stimulation Goals  Tone;Pain      Manual Therapy   Manual therapy comments  pt supine     Soft tissue mobilization  deep tissue work through the Rt LE - Rt  hip psoas; adductors; hip flexors; quads     Myofascial Release  Rt adductors; Rt quad; Rt lateral thigh              PT Education - 04/28/17 0922    Education provided  Yes    Education Details  HEP     Person(s) Educated  Patient    Methods  Explanation;Demonstration;Tactile cues;Verbal cues;Handout    Comprehension  Verbalized understanding;Returned demonstration;Verbal cues required;Tactile  cues required          PT Long Term Goals - 04/28/17 0851      PT LONG TERM GOAL #1   Title  Improve posture and alignment with patient to demonstrate good upright posture with hips extended and knees out of hyperextension. 06/23/17    Time  20    Period  Weeks    Status  Revised      PT LONG TERM GOAL #2   Title  Improve joint mobility and soft tissue extensibility in bilat LE's and trunk with patient demonstrating good joint ROM bilat hips 06/23/17    Time  20    Period  Weeks    Status  Revised      PT LONG TERM GOAL #3   Title  Patient reports 70-90% decrease in hip/LE pain 06/23/17    Time  20    Period  Weeks    Status  Revised      PT LONG TERM GOAL #4   Title  Independent in HEP with focus on appropriate stretching 06/23/17    Time  20    Period  Weeks    Status  Revised      PT LONG TERM GOAL #5   Title  Improve FOTO to </= 30% limitation 06/23/17    Time  20    Period  Weeks    Status  Revised             Plan - 04/28/17 1235    Clinical Impression Statement  Patient demonstrates continued gradual progress with decreasing pain and improving tissue extensibility and ROM. She has less palpable tightness through the Rt lower quarter. Patient has increased functional activity level but has not returned to level of activity she has tolerated in the past. Patient responds well to manual therapy and DN. She is working on exercises consistently at home. Patient will benefit from continued PT to achieve goals and reach maximum rehab potential.     Rehab Potential  Good  PT Frequency  1x / week    PT Duration  8 weeks    PT Treatment/Interventions  Patient/family education;ADLs/Self Care Home Management;Cryotherapy;Electrical Stimulation;Iontophoresis 4mg /ml Dexamethasone;Moist Heat;Ultrasound;Dry needling;Manual techniques;Neuromuscular re-education;Therapeutic activities;Therapeutic exercise    PT Next Visit Plan  assess pelvis alignment as indicated; continue DN  and manual therapy - decrease frequency to 1x/wk     Consulted and Agree with Plan of Care  Patient       Patient will benefit from skilled therapeutic intervention in order to improve the following deficits and impairments:  Postural dysfunction, Improper body mechanics, Pain, Increased muscle spasms, Impaired perceived functional ability, Hypomobility, Decreased mobility, Decreased range of motion, Decreased activity tolerance  Visit Diagnosis: Pain in right hip - Plan: PT plan of care cert/re-cert  Other symptoms and signs involving the musculoskeletal system - Plan: PT plan of care cert/re-cert  Pain in left hip - Plan: PT plan of care cert/re-cert  Pain of right upper extremity - Plan: PT plan of care cert/re-cert     Problem List Patient Active Problem List   Diagnosis Date Noted  . Primary osteoarthritis of both hips 01/20/2017  . Chronic right shoulder pain 01/20/2017  . Cough 08/19/2014  . Pertussis exposure 08/19/2014  . 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 ALLERGIC RHINITIS 05/29/2009  . BACK PAIN, THORACIC REGION 03/15/2009  . FOOT PAIN, LEFT 01/18/2009  . IRON DEFICIENCY 11/16/2007  . DIZZINESS 11/16/2007    Celyn Rober Minion PT, MPH  04/28/2017, 12:44 PM  [facility] [address] [place], [place], [address] Phone: [phone]   Fax:  [phone]  Name: Julia Griffith MRN: [id number] Date of Birth: 10-19-1970

## 2017-04-28 NOTE — Patient Instructions (Signed)
Abdominal Bracing With Pelvic Floor (Hook-Lying)    With neutral spine, tighten pelvic floor and abdominals sucking belly button to back bone; tighten muscles in low back at waist, exhale. Hold 10 sec  Repeat _10__ times. Do _several__ times a day. Progress to do this in sitting; standing; walking and with functional activities   Copyright  VHI. All rights reserved.

## 2017-05-09 ENCOUNTER — Encounter: Payer: Self-pay | Admitting: Rehabilitative and Restorative Service Providers"

## 2017-05-09 ENCOUNTER — Ambulatory Visit (INDEPENDENT_AMBULATORY_CARE_PROVIDER_SITE_OTHER): Payer: 59 | Admitting: Rehabilitative and Restorative Service Providers"

## 2017-05-09 DIAGNOSIS — R29898 Other symptoms and signs involving the musculoskeletal system: Secondary | ICD-10-CM | POA: Diagnosis not present

## 2017-05-09 DIAGNOSIS — M79601 Pain in right arm: Secondary | ICD-10-CM

## 2017-05-09 DIAGNOSIS — M25551 Pain in right hip: Secondary | ICD-10-CM | POA: Diagnosis not present

## 2017-05-09 DIAGNOSIS — M25552 Pain in left hip: Secondary | ICD-10-CM

## 2017-05-09 NOTE — Therapy (Signed)
[facility] [place] [address] [place], [place], [address] Phone: [phone]   Fax:  [phone]  Physical Therapy Treatment  Patient Details  Name: Julia Griffith MRN: [id number] Date of Birth: 12-08-1970 Referring Provider: Dr Benjamin Stain   Encounter Date: 05/09/2017  PT End of Session - 05/09/17 0807    Visit Number  23    Number of Visits  32    Date for PT Re-Evaluation  06/23/17    PT Start Time  0804    PT Stop Time  0901    PT Time Calculation (min)  57 min    Activity Tolerance  Patient tolerated treatment well       Past Medical History:  Diagnosis Date  . Osteoarthritis of right hip 10/19/2012  . Postpartum depression     Past Surgical History:  Procedure Laterality Date  . LEEP    . Right TM repair    . thumb and middle finger surgery     overuse injury    There were no vitals filed for this visit.  Subjective Assessment - 05/09/17 0809    Subjective  Driving hours to and from [place] and walking a lot while on vacation. Noticed increased lateral Rt hip and thigh pain.     Currently in Pain?  Yes    Pain Score  2     Pain Location  Hip    Pain Orientation  Right;Anterior    Pain Descriptors / Indicators  Tightness    Pain Type  Chronic pain    Pain Onset  More than a month ago    Pain Frequency  Intermittent                       [facility] Adult PT Treatment/Exercise - 05/09/17 0001      Lumbar Exercises: Stretches   Hip Flexor Stretch  3 reps;30 seconds thomas supine     Other Lumbar Stretch Exercise  adductor stretch with PT assist 30 sec x 3; sartorius stretch with PT assist 30-45 sec x 3      Knee/Hip Exercises: Stretches   Piriformis Stretch  Right;Left;2 reps;30 seconds    Other Knee/Hip Stretches  adductor stretch frog leg supine       Moist Heat Therapy   Number Minutes Moist Heat  20 Minutes    Moist Heat Location  Hip Rt inner and outer hip; posas        Electrical Stimulation   Electrical Stimulation Location  Rt adductors; Rt lateral thigh     Electrical Stimulation Action  IFC    Electrical Stimulation Parameters  to tolerance    Electrical Stimulation Goals  Tone;Pain      Manual Therapy   Manual therapy comments  pt supine     Soft tissue mobilization  deep tissue work through the Rt LE - Rt  hip psoas; adductors; hip flexors; quads     Myofascial Release  Rt adductors; Rt quad; Rt lateral thigh        Trigger Point Dry Needling - 05/09/17 0826    Consent Given?  Yes    Muscles Treated Lower Body  -- Rt LE with estim     Tensor Fascia Lata Response  Palpable increased muscle length    Quadriceps Response  Palpable increased muscle length    Adductor Response  Palpable increased muscle length                PT  Long Term Goals - 05/09/17 0807      PT LONG TERM GOAL #1   Title  Improve posture and alignment with patient to demonstrate good upright posture with hips extended and knees out of hyperextension. 06/23/17    Time  20    Period  Weeks    Status  On-going      PT LONG TERM GOAL #2   Title  Improve joint mobility and soft tissue extensibility in bilat LE's and trunk with patient demonstrating good joint ROM bilat hips 06/23/17    Time  20    Period  Weeks    Status  On-going      PT LONG TERM GOAL #3   Title  Patient reports 70-90% decrease in hip/LE pain 06/23/17    Time  20    Period  Weeks    Status  On-going      PT LONG TERM GOAL #4   Title  Independent in HEP with focus on appropriate stretching 06/23/17    Time  20    Period  Weeks    Status  On-going      PT LONG TERM GOAL #5   Title  Improve FOTO to </= 30% limitation 06/23/17    Time  20    Period  Weeks    Status  On-going            Plan - 05/09/17 0808    Clinical Impression Statement  Flare up of Rt hip and LE pain - especially laterally - likely related to driving and walking while on vacation in [place]. Increased palpable  tightness noted through anterior and lateral Rt hip muculature. Responded well to DN and manual work. Will continue work toward goals of therapy.     Rehab Potential  Good    PT Frequency  1x / week    PT Duration  8 weeks    PT Treatment/Interventions  Patient/family education;ADLs/Self Care Home Management;Cryotherapy;Electrical Stimulation;Iontophoresis /ml Dexamethasone;Moist Heat;Ultrasound;Dry needling;Manual techniques;Neuromuscular re-education;Therapeutic activities;Therapeutic exercise    PT Next Visit Plan  assess pelvis alignment as indicated; continue DN and manual therapy - decrease frequency to 1x/wk     Consulted and Agree with Plan of Care  Patient       Patient will benefit from skilled therapeutic intervention in order to improve the following deficits and impairments:  Postural dysfunction, Improper body mechanics, Pain, Increased muscle spasms, Impaired perceived functional ability, Hypomobility, Decreased mobility, Decreased range of motion, Decreased activity tolerance  Visit Diagnosis: Pain in right hip  Other symptoms and signs involving the musculoskeletal system  Pain in left hip  Pain of right upper extremity     Problem List Patient Active Problem List   Diagnosis Date Noted  . Primary osteoarthritis of both hips 01/20/2017  . Chronic right shoulder pain 01/20/2017  . Cough 08/19/2014  . Pertussis exposure 08/19/2014  . 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 ALLERGIC RHINITIS 05/29/2009  . BACK PAIN, THORACIC REGION 03/15/2009  . FOOT PAIN, LEFT 01/18/2009  . IRON DEFICIENCY 11/16/2007  . DIZZINESS 11/16/2007    Kj Imbert Rober Minion PT, MPH  05/09/2017, 8:28 AM  [facility] [address] [place], [place], [address] Phone: [phone]   Fax:  [phone]  Name: Julia Griffith MRN: [id number] Date of Birth: 01/06/1971

## 2017-05-14 ENCOUNTER — Encounter: Payer: 59 | Admitting: Rehabilitative and Restorative Service Providers"

## 2017-05-15 ENCOUNTER — Ambulatory Visit (INDEPENDENT_AMBULATORY_CARE_PROVIDER_SITE_OTHER): Payer: 59 | Admitting: Physical Therapy

## 2017-05-15 DIAGNOSIS — R29898 Other symptoms and signs involving the musculoskeletal system: Secondary | ICD-10-CM

## 2017-05-15 DIAGNOSIS — M25551 Pain in right hip: Secondary | ICD-10-CM | POA: Diagnosis not present

## 2017-05-15 NOTE — Therapy (Signed)
[place], [place], [address] Phone: [phone]   Fax:  [phone]  Physical Therapy Treatment  Patient Details  Name: Julia Griffith MRN: [id number] Date of Birth: February 16, 1970 Referring Provider: Dr. Dianah Field   Encounter Date: 05/15/2017  PT End of Session - 05/15/17 1125    Visit Number  24    Number of Visits  32    Date for PT Re-Evaluation  06/23/17    PT Start Time  1104    PT Stop Time  1157    PT Time Calculation (min)  53 min    Behavior During Therapy  [facility] for tasks assessed/performed       Past Medical History:  Diagnosis Date  . Osteoarthritis of right hip 10/19/2012  . Postpartum depression     Past Surgical History:  Procedure Laterality Date  . LEEP    . Right TM repair    . thumb and middle finger surgery     overuse injury    There were no vitals filed for this visit.  Subjective Assessment - 05/15/17 1124    Subjective  Pt reports her Rt hip is still flared up from drive to [place]. It just won't let up yet.     Patient Stated Goals  increase flexibility and ROM; decrease pain in hips     Currently in Pain?  Yes    Pain Score  2     Pain Location  Hip    Pain Orientation  Right;Lateral;Medial    Pain Descriptors / Indicators  Tightness    Aggravating Factors   prolonged sitting    Pain Relieving Factors  stretching, heat, TENS, DN.          [facility] PT Assessment - 05/15/17 0001      Assessment   Medical Diagnosis  Bilat hip pain; Rt biceps pain     Referring Provider  Dr. 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  ilium = in standing.        [facility] Adult PT Treatment/Exercise - 05/15/17 0001      Moist Heat Therapy   Number Minutes Moist Heat  20 Minutes    Moist Heat Location  Hip Rt inner and outer hip; posas        Electrical Stimulation   Electrical Stimulation Location  Rt adductors; Rt lateral thigh     Electrical Stimulation Action  IFC    Electrical Stimulation Parameters  to tolerance    Electrical Stimulation Goals  Tone;Pain      Manual Therapy   Manual therapy comments  pt in Lt sidelying     Soft tissue mobilization  deep tissue work; STM and TPR to Rt glute med/min, piriformis, obturator internus, and Lt lumbar paraspinals.     Myofascial Release  Rt TFL/lateral proximal thigh; Rt glute med    Muscle Energy Technique  MET to correct Rt sacral torsion in prone with contract relax of hip IR.         Trigger Point Dry Needling - 05/15/17 1142    Consent Given?  Yes    Muscles Treated Lower Body  -- Lt hip and LB with estim     Gluteus Maximus Response  Palpable increased muscle length  Gluteus Minimus Response  Palpable increased muscle length    Piriformis Response  Palpable increased muscle length    Tensor Fascia Lata Response  Palpable increased muscle length    Hamstring Response  Palpable increased muscle length                PT Long Term Goals - 05/15/17 1139      PT LONG TERM GOAL #1   Title  Improve posture and alignment with patient to demonstrate good upright posture with hips extended and knees out of hyperextension. 06/23/17    Time  20    Period  Weeks    Status  Partially Met      PT LONG TERM GOAL #2   Title  Improve joint mobility and soft tissue extensibility in bilat LE's and trunk with patient demonstrating good joint ROM bilat hips 06/23/17    Time  20    Period  Weeks    Status  On-going      PT LONG TERM GOAL #3   Title  Patient reports 70-90% decrease in hip/LE pain 06/23/17    Time  20    Period  Weeks    Status  Achieved reported 75% on 05/15/17      PT LONG TERM GOAL #4   Title  Independent in HEP with focus on appropriate stretching 06/23/17    Time  20    Status  On-going      PT LONG TERM GOAL #5   Title  Improve FOTO to </=  30% limitation 06/23/17    Time  20    Period  Weeks    Status  On-going            Plan - 05/15/17 1134    Clinical Impression Statement  Continued flare up of Rt hip likely from long drive to/from [place].  Persistent palpable tightness through Rt lateral hip musculature.  Pelvis alignment improved with MET correction and good response to DN/manual work.  She has met LTG#3 and is making Gradual gains towards remaining goals of therapy.     Rehab Potential  Good    PT Frequency  1x / week    PT Duration  8 weeks    PT Treatment/Interventions  Patient/family education;ADLs/Self Care Home Management;Cryotherapy;Electrical Stimulation;Iontophoresis 9m/ml Dexamethasone;Moist Heat;Ultrasound;Dry needling;Manual techniques;Neuromuscular re-education;Therapeutic activities;Therapeutic exercise    PT Next Visit Plan  assess pelvis alignment as indicated; continue DN and manual therapy     Consulted and Agree with Plan of Care  Patient       Patient will benefit from skilled therapeutic intervention in order to improve the following deficits and impairments:  Postural dysfunction, Improper body mechanics, Pain, Increased muscle spasms, Impaired perceived functional ability, Hypomobility, Decreased mobility, Decreased range of motion, Decreased activity tolerance  Visit Diagnosis: Pain in right hip  Other symptoms and signs involving the musculoskeletal system     Problem List Patient Active Problem List   Diagnosis Date Noted  . Primary osteoarthritis of both hips 01/20/2017  . Chronic right shoulder pain 01/20/2017  . Cough 08/19/2014  . Pertussis exposure 08/19/2014  . 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 ALLERGIC RHINITIS 05/29/2009  . BACK PAIN, THORACIC REGION 03/15/2009  . FOOT PAIN, LEFT 01/18/2009  . IRON DEFICIENCY 11/16/2007  . DIZZINESS 11/16/2007   JKerin Perna PTA 05/15/17 11:44 AM   Celyn P. HHelene KelpPT, MPH 05/15/17 11:44 AM    [place]  [place], [place], [address] Phone: [phone]   Fax:  [phone]  Name: Julia Griffith MRN: [id number] Date of Birth: [birth year]

## 2017-05-21 ENCOUNTER — Encounter: Payer: Self-pay | Admitting: Rehabilitative and Restorative Service Providers"

## 2017-05-21 ENCOUNTER — Ambulatory Visit (INDEPENDENT_AMBULATORY_CARE_PROVIDER_SITE_OTHER): Payer: 59 | Admitting: Rehabilitative and Restorative Service Providers"

## 2017-05-21 DIAGNOSIS — M25552 Pain in left hip: Secondary | ICD-10-CM

## 2017-05-21 DIAGNOSIS — M25551 Pain in right hip: Secondary | ICD-10-CM

## 2017-05-21 DIAGNOSIS — R29898 Other symptoms and signs involving the musculoskeletal system: Secondary | ICD-10-CM

## 2017-05-21 NOTE — Therapy (Signed)
[place], [place], [address] Phone: [phone]   Fax:  [phone]  Physical Therapy Treatment  Patient Details  Name: Julia Griffith MRN: [id number] Date of Birth: [birth year] Referring Provider: Dr Dianah Field    Encounter Date: 05/21/2017  PT End of Session - 05/21/17 0806    Visit Number  25    Number of Visits  32    Date for PT Re-Evaluation  06/23/17    PT Start Time  0802    PT Stop Time  0852    PT Time Calculation (min)  50 min    Activity Tolerance  Patient tolerated treatment well       Past Medical History:  Diagnosis Date  . Osteoarthritis of right hip 10/19/2012  . Postpartum depression     Past Surgical History:  Procedure Laterality Date  . LEEP    . Right TM repair    . thumb and middle finger surgery     overuse injury    There were no vitals filed for this visit.  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 Having less pain overall but some difficulty continues. Has some pain at night when she bends the Rt knee - now sleeping with Lt knee bent and Rt straight     Currently in Pain?  Yes    Pain Score  1     Pain Location  Hip    Pain Orientation  Right;Lateral    Pain Type  Chronic pain    Pain Onset  More than a month ago    Pain Frequency  Intermittent         [facility] PT Assessment - 05/21/17 0001      Assessment   Medical Diagnosis  Bilat hip pain; Rt biceps pain     Referring Provider  Dr Dianah Field     Onset Date/Surgical Date  01/07/10    Hand Dominance  Right    Next MD Visit  PRN      Palpation   Palpation comment  tenderness Rt TFL/ITB; adductor longus and gracilus (prox and distally); hip flexors; sartorius                    [facility] Adult PT Treatment/Exercise - 05/21/17 0001      Moist Heat Therapy   Number Minutes  Moist Heat  20 Minutes    Moist Heat Location  Hip Rt inner and outer hip; posas       Electrical Stimulation   Electrical Stimulation Location  Rt abductors; Rt lateral thigh     Electrical Stimulation Action  TENs     Electrical Stimulation Parameters  to tolerance    Electrical Stimulation Goals  Tone;Pain      Manual Therapy   Manual therapy comments  pt in Lt sidelying     Soft tissue mobilization  deep tissue work; STM and TPR to Rt glute med/min, piriformis, obturator internus, and Lt lumbar paraspinals.     Myofascial Release  Rt TFL/lateral proximal thigh; Rt glute med       Trigger Point Dry Needling - 05/21/17 0831    Consent Given?  Yes    Muscles Treated Lower Body  -- Rt with estim     Gluteus Maximus Response  Palpable increased muscle length    Gluteus Minimus Response  Palpable increased muscle length    Piriformis Response  Palpable increased muscle length    Tensor Fascia Lata Response  Palpable increased muscle length                PT Long Term Goals - 05/21/17 7169      PT LONG TERM GOAL #1   Title  Improve posture and alignment with patient to demonstrate good upright posture with hips extended and knees out of hyperextension. 06/23/17    Time  20    Period  Weeks    Status  Partially Met      PT LONG TERM GOAL #2   Title  Improve joint mobility and soft tissue extensibility in bilat LE's and trunk with patient demonstrating good joint ROM bilat hips 06/23/17    Time  20    Period  Weeks    Status  On-going      PT LONG TERM GOAL #3   Title  Patient reports 70-90% decrease in hip/LE pain 06/23/17    Time  20    Period  Weeks    Status  Achieved      PT LONG TERM GOAL #4   Title  Independent in HEP with focus on appropriate stretching 06/23/17    Time  20    Period  Weeks    Status  Partially Met      PT LONG TERM GOAL #5   Title  Improve FOTO to </= 30% limitation 06/23/17    Time  20    Period  Weeks    Status  On-going             Plan - 05/21/17 6789    Clinical Impression Statement  Improving Rt hip pain and discomfort. 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 There is less palpable tightness through the Rt lateral and posterior hip. Patient responds well to DN and manual work.     Rehab Potential  Good    PT Frequency  1x / week    PT Duration  8 weeks    PT Treatment/Interventions  Patient/family education;ADLs/Self Care Home Management;Cryotherapy;Electrical Stimulation;Iontophoresis 20m/ml Dexamethasone;Moist Heat;Ultrasound;Dry needling;Manual techniques;Neuromuscular re-education;Therapeutic activities;Therapeutic exercise    PT Next Visit Plan  assess pelvis alignment as indicated; continue DN and manual therapy     Consulted and Agree with Plan of Care  Patient       Patient will benefit from skilled therapeutic intervention in order to improve the following deficits and impairments:  Postural dysfunction, Improper body mechanics, Pain, Increased muscle spasms, Impaired perceived functional ability, Hypomobility, Decreased mobility, Decreased range of motion, Decreased activity tolerance  Visit Diagnosis: Pain in right hip  Other symptoms and signs involving the musculoskeletal system  Pain in left hip     Problem List Patient Active Problem List   Diagnosis Date Noted  . Primary osteoarthritis of both hips 01/20/2017  . Chronic right shoulder pain 01/20/2017  . Cough 08/19/2014  . Pertussis exposure 08/19/2014  . 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 ALLERGIC RHINITIS 05/29/2009  . BACK PAIN, THORACIC REGION 03/15/2009  . FOOT PAIN, LEFT 01/18/2009  . IRON DEFICIENCY 11/16/2007  . DIZZINESS 11/16/2007    Julia Griffith Julia SimmerPT, MPH  05/21/2017, 8:45 AM  [facility] [address]Phone: [phone]  Fax:  [phone] Name: Julia DavillaMRN: [id number] of Birth:  110/24/1972

## 2017-05-28 ENCOUNTER — Encounter: Payer: 59 | Admitting: Physical Therapy

## 2017-06-04 ENCOUNTER — Ambulatory Visit (INDEPENDENT_AMBULATORY_CARE_PROVIDER_SITE_OTHER): Payer: 59 | Admitting: Rehabilitative and Restorative Service Providers"

## 2017-06-04 ENCOUNTER — Encounter: Payer: Self-pay | Admitting: Rehabilitative and Restorative Service Providers"

## 2017-06-04 DIAGNOSIS — M25552 Pain in left hip: Secondary | ICD-10-CM

## 2017-06-04 DIAGNOSIS — M79601 Pain in right arm: Secondary | ICD-10-CM

## 2017-06-04 DIAGNOSIS — M25551 Pain in right hip: Secondary | ICD-10-CM

## 2017-06-04 DIAGNOSIS — R29898 Other symptoms and signs involving the musculoskeletal system: Secondary | ICD-10-CM | POA: Diagnosis not present

## 2017-06-04 NOTE — Therapy (Signed)
[place], [place], [address] Phone: [phone]   Fax:  [phone]  Physical Therapy Treatment  Patient Details  Name: Julia Griffith MRN: [id number] Date of Birth: [birth year] Referring Provider: Dr Dianah Field   Encounter Date: 06/04/2017  PT End of Session - 06/04/17 0718    Visit Number  26    Number of Visits  32    Date for PT Re-Evaluation  06/23/17    PT Start Time  0715    PT Stop Time  0811    PT Time Calculation (min)  56 min    Activity Tolerance  Patient tolerated treatment well       Past Medical History:  Diagnosis Date  . Osteoarthritis of right hip 10/19/2012  . Postpartum depression     Past Surgical History:  Procedure Laterality Date  . LEEP    . Right TM repair    . thumb and middle finger surgery     overuse injury    There were no vitals filed for this visit.  Subjective Assessment - 06/04/17 0732    Subjective  Patient reports that she mowed for 3 hours yesterday and had significant increase pain last night. Some better this moring - just sore. Feels that she has a tug of war going on from the inside to outside of the thigh. Improved with stretchig and  treatment today.    Currently in Pain?  Yes    Pain Score  2     Pain Location  Hip    Pain Orientation  Right;Medial;Lateral    Pain Descriptors / Indicators  Tightness    Pain Type  Chronic pain    Pain Onset  More than a month ago    Pain Frequency  Intermittent         [facility] PT Assessment - 06/04/17 0001      Assessment   Medical Diagnosis  Bilat hip pain; Rt biceps pain     Referring Provider  Dr Dianah Field    Onset Date/Surgical Date  01/07/10    Hand Dominance  Right    Next MD Visit  PRN      Palpation   SI assessment   pelvis level in standing     Palpation comment  tenderness Rt TFL/ITB; adductor longus and gracilus (prox and distally); hip flexors; sartorius                     [facility] Adult PT Treatment/Exercise - 06/04/17 0001      Moist Heat Therapy   Number Minutes Moist Heat  20 Minutes    Moist Heat Location  Hip Rt inner and outer hip; posas       Electrical Stimulation   Electrical Stimulation Location  Rt abductors; Rt lateral thigh     Electrical Stimulation Action  IFC    Electrical Stimulation Parameters  to tolerance    Electrical Stimulation Goals  Tone;Pain      Manual Therapy   Manual therapy comments  pt supine    Joint Mobilization  Rt hip AP mobs    Soft tissue mobilization  deep tissue work; STM and TPR to Rt glute med/min, piriformis, obturator internus, and Lt lumbar paraspinals.     Myofascial Release  Rt TFL/lateral proximal thigh; Rt glute med       Trigger Point Dry Needling - 06/04/17 0754    Consent Given?  Yes    Muscles Treated Lower  Body  -- Rt with estim - sartorius     Adductor Response  Palpable increased muscle length           PT Education - 06/04/17 0801    Education provided  Yes    Education Details  education re positioning for sitting with yard work and other functional activities avoiding knees dropping out to the side in sitting - should work to keep hips/knees/feet in good alignment.     Person(s) Educated  Patient    Methods  Explanation    Comprehension  Verbalized understanding          PT Long Term Goals - 06/04/17 0806      PT LONG TERM GOAL #1   Title  Improve posture and alignment with patient to demonstrate good upright posture with hips extended and knees out of hyperextension. 06/23/17    Time  20    Period  Weeks    Status  Achieved      PT LONG TERM GOAL #2   Title  Improve joint mobility and soft tissue extensibility in bilat LE's and trunk with patient demonstrating good joint ROM bilat hips 06/23/17    Time  20    Period  Weeks    Status  On-going      PT LONG TERM GOAL #3   Title  Patient reports 70-90% decrease in hip/LE pain 06/23/17    Time  20     Period  Weeks    Status  Achieved      PT LONG TERM GOAL #4   Title  Independent in HEP with focus on appropriate stretching 06/23/17    Time  20    Period  Weeks    Status  Partially Met      PT LONG TERM GOAL #5   Title  Improve FOTO to </= 30% limitation 06/23/17    Time  20    Period  Weeks    Status  On-going            Plan - 06/04/17 0802    Clinical Impression Statement  Increased Rt hip pain - medially and laterally - after sitting on a mower for ~ 3 hours last night. Patient has significant increase in palpable tightness through the hip adductors including the sartorius and adductors as well as psoas and lateral hip musculature. Responded well to manual work and DN as well as stretching. She continued to have soreness and tightness.     Rehab Potential  Good    PT Frequency  1x / week    PT Duration  8 weeks    PT Treatment/Interventions  Patient/family education;ADLs/Self Care Home Management;Cryotherapy;Electrical Stimulation;Iontophoresis 63m/ml Dexamethasone;Moist Heat;Ultrasound;Dry needling;Manual techniques;Neuromuscular re-education;Therapeutic activities;Therapeutic exercise    PT Next Visit Plan  assess pelvis alignment as indicated; continue DN and manual therapy; education re body mechanics and activities     Consulted and Agree with Plan of Care  Patient       Patient will benefit from skilled therapeutic intervention in order to improve the following deficits and impairments:  Postural dysfunction, Improper body mechanics, Pain, Increased muscle spasms, Impaired perceived functional ability, Hypomobility, Decreased mobility, Decreased range of motion, Decreased activity tolerance  Visit Diagnosis: Pain in right hip  Other symptoms and signs involving the musculoskeletal system  Pain in left hip  Pain of right upper extremity     Problem List Patient Active Problem List   Diagnosis Date Noted  . Primary osteoarthritis of  both hips 01/20/2017   . Chronic right shoulder pain 01/20/2017  . Cough 08/19/2014  . Pertussis exposure 08/19/2014  . 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 ALLERGIC RHINITIS 05/29/2009  . BACK PAIN, THORACIC REGION 03/15/2009  . FOOT PAIN, LEFT 01/18/2009  . IRON DEFICIENCY 11/16/2007  . DIZZINESS 11/16/2007    Jailan Trimm Nilda Simmer PT, MPH  06/04/2017, 8:08 AM  [facility] [place], [place], [address] Phone: [phone]   Fax:  [phone]  Name: Julia Griffith MRN: [id number] Date of Birth: September 03, 1970

## 2017-06-10 ENCOUNTER — Ambulatory Visit (INDEPENDENT_AMBULATORY_CARE_PROVIDER_SITE_OTHER): Payer: 59 | Admitting: Rehabilitative and Restorative Service Providers"

## 2017-06-10 ENCOUNTER — Encounter: Payer: Self-pay | Admitting: Rehabilitative and Restorative Service Providers"

## 2017-06-10 DIAGNOSIS — M25551 Pain in right hip: Secondary | ICD-10-CM

## 2017-06-10 DIAGNOSIS — M79601 Pain in right arm: Secondary | ICD-10-CM | POA: Diagnosis not present

## 2017-06-10 DIAGNOSIS — M25552 Pain in left hip: Secondary | ICD-10-CM | POA: Diagnosis not present

## 2017-06-10 DIAGNOSIS — R29898 Other symptoms and signs involving the musculoskeletal system: Secondary | ICD-10-CM | POA: Diagnosis not present

## 2017-06-10 NOTE — Therapy (Signed)
[facility] [address] [place], [place], [address] Phone: [phone]   Fax:  [phone]  Physical Therapy Treatment  Patient Details  Name: Julia Griffith MRN: [id number] Date of Birth: 03/25/1970 Referring Provider: Dr Thekkekandam   Encounter Date: 06/10/2017  PT End of Session - 06/10/17 0809    Visit Number  27    Number of Visits  32    Date for PT Re-Evaluation  06/23/17    PT Start Time  0800    PT Stop Time  0856    PT Time Calculation (min)  56 min    Activity Tolerance  Patient tolerated treatment well       Past Medical History:  Diagnosis Date  . Osteoarthritis of right hip 10/19/2012  . Postpartum depression     Past Surgical History:  Procedure Laterality Date  . LEEP    . Right TM repair    . thumb and middle finger surgery     overuse injury    There were no vitals filed for this visit.  Subjective Assessment - 06/10/17 0809    Subjective  Patient reports continued tightness and discomfort in the inner thigh and lateral hip.     Pain Score  2     Pain Location  Hip    Pain Orientation  Right;Medial;Lateral    Pain Descriptors / Indicators  Tightness    Pain Type  Chronic pain                       [facility] Adult PT Treatment/Exercise - 06/10/17 0001      Knee/Hip Exercises: Stretches   Passive Hamstring Stretch  Right;Left;30 seconds;1 rep    Quad Stretch  Right;2 reps;20 seconds    Hip Flexor Stretch  30 seconds;3 reps;Limitations    Hip Flexor Stretch Limitations  1 rep in prone with one leg off table    ITB Stretch  Right;Left;2 reps;30 seconds    Piriformis Stretch  Right;Left;2 reps;30 seconds      Moist Heat Therapy   Number Minutes Moist Heat  15 Minutes    Moist Heat Location  Hip Rt inner and outer hip; posas       Electrical Stimulation   Electrical Stimulation Location  Rt adductors; Rt lateral thigh     Electrical Stimulation Action  IFC     Electrical Stimulation Parameters  to tolerance    Electrical Stimulation Goals  Tone;Pain      Iontophoresis   Type of Iontophoresis  Dexamethasone    Location  Rt lateral hip     Dose  80 mAmp dose    Time  10 - 12 hours       Manual Therapy   Manual therapy comments  pt supine    Soft tissue mobilization  deep tissue work; STM adductors; sartorius; Rt glut med/min    Myofascial Release  Rt hip adductors       Trigger Point Dry Needling - 06/10/17 1243    Consent Given?  Yes    Muscles Treated Lower Body  -- Rt with estim - hip flexors/sartorius    Adductor Response  Palpable increased muscle length;Twitch response elicited           PT Education - 06/10/17 0848    Education provided  Yes    Education Details  ionto    Person(s) Educated  Patient    Methods  Explanation    Comprehension    Verbalized understanding          PT Long Term Goals - 06/10/17 1246      PT LONG TERM GOAL #1   Title  Improve posture and alignment with patient to demonstrate good upright posture with hips extended and knees out of hyperextension. 06/23/17    Time  20    Period  Weeks    Status  Achieved      PT LONG TERM GOAL #2   Title  Improve joint mobility and soft tissue extensibility in bilat LE's and trunk with patient demonstrating good joint ROM bilat hips 06/23/17    Time  20    Period  Weeks    Status  On-going      PT LONG TERM GOAL #3   Title  Patient reports 70-90% decrease in hip/LE pain 06/23/17    Time  20    Period  Weeks    Status  Achieved      PT LONG TERM GOAL #4   Title  Independent in HEP with focus on appropriate stretching 06/23/17    Time  20    Status  Partially Met      PT LONG TERM GOAL #5   Title  Improve FOTO to </= 30% limitation 06/23/17    Time  20    Period  Weeks    Status  On-going            Plan - 06/10/17 1244    Clinical Impression Statement  Rt hip remains tight and painful - although pain is less than it was. Patient was  reminded of stretches for hip adductors and sartorius as well as ball release work to Rt psoas. She should avoid prolonged sitting. Trial of ionto to TP lateral Rt hip.     Rehab Potential  Good    PT Frequency  1x / week    PT Duration  8 weeks    PT Treatment/Interventions  Patient/family education;ADLs/Self Care Home Management;Cryotherapy;Electrical Stimulation;Iontophoresis 4mg/ml Dexamethasone;Moist Heat;Ultrasound;Dry needling;Manual techniques;Neuromuscular re-education;Therapeutic activities;Therapeutic exercise    PT Next Visit Plan  assess pelvis alignment as indicated; continue DN and manual therapy; education re body mechanics and activities Assess response to ionto     Consulted and Agree with Plan of Care  Patient       Patient will benefit from skilled therapeutic intervention in order to improve the following deficits and impairments:  Postural dysfunction, Improper body mechanics, Pain, Increased muscle spasms, Impaired perceived functional ability, Hypomobility, Decreased mobility, Decreased range of motion, Decreased activity tolerance  Visit Diagnosis: Pain in right hip  Other symptoms and signs involving the musculoskeletal system  Pain in left hip  Pain of right upper extremity     Problem List Patient Active Problem List   Diagnosis Date Noted  . Primary osteoarthritis of both hips 01/20/2017  . Chronic right shoulder pain 01/20/2017  . Cough 08/19/2014  . Pertussis exposure 08/19/2014  . 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 ALLERGIC RHINITIS 05/29/2009  . BACK PAIN, THORACIC REGION 03/15/2009  . FOOT PAIN, LEFT 01/18/2009  . IRON DEFICIENCY 11/16/2007  . DIZZINESS 11/16/2007    Julia Griffith PT, MPH  06/10/2017, 12:51 PM  [facility] [address] [place], [place], [address] Phone: [phone]   Fax:  [phone]  Name: Julia Griffith MRN: [id number] Date of Birth: 03/06/1970   

## 2017-06-10 NOTE — Patient Instructions (Signed)

## 2017-06-18 ENCOUNTER — Ambulatory Visit (INDEPENDENT_AMBULATORY_CARE_PROVIDER_SITE_OTHER): Payer: 59 | Admitting: Physical Therapy

## 2017-06-18 DIAGNOSIS — M25551 Pain in right hip: Secondary | ICD-10-CM

## 2017-06-18 DIAGNOSIS — R29898 Other symptoms and signs involving the musculoskeletal system: Secondary | ICD-10-CM

## 2017-06-18 NOTE — Therapy (Signed)
[place], [place], [address] Phone: [phone]   Fax:  [phone]  Physical Therapy Treatment  Patient Details  Name: Julia Griffith MRN: [id number] Date of Birth: April 10, 1970 Referring Provider: Dr. Dianah Field   Encounter Date: 06/18/2017  PT End of Session - 06/18/17 1410    Visit Number  28    Number of Visits  32    Date for PT Re-Evaluation  06/23/17    PT Start Time  2010    PT Stop Time  1452    PT Time Calculation (min)  49 min       Past Medical History:  Diagnosis Date  . Osteoarthritis of right hip 10/19/2012  . Postpartum depression     Past Surgical History:  Procedure Laterality Date  . LEEP    . Right TM repair    . thumb and middle finger surgery     overuse injury    There were no vitals filed for this visit.  Subjective Assessment - 06/18/17 1412    Subjective  Pt reports was really in agony this morning with tightness with right leg and outter hip. It has loosened up since been moving around. No other new changes since last visit.    Currently in Pain?  Yes    Pain Score  1     Pain Location  Hip    Pain Orientation  Right    Pain Descriptors / Indicators  Tightness;Aching;Dull    Aggravating Factors   prolonged sitting    Pain Relieving Factors  movement         [facility] PT Assessment - 06/18/17 0001      Assessment   Medical Diagnosis  Bilat hip pain; Rt biceps pain     Referring Provider  Dr. Dianah Field    Onset Date/Surgical Date  01/07/10    Hand Dominance  Right    Next MD Visit  PRN       [facility] Adult PT Treatment/Exercise - 06/18/17 0001      Moist Heat Therapy   Number Minutes Moist Heat  20 Minutes    Moist Heat Location  Hip;Lumbar Spine Rt lateral thigh, low back      Electrical Stimulation   Electrical Stimulation Location  Rt QL, glute med, obturator internus, lateral prox R thigh.     Electrical Stimulation Action  IFC    Electrical Stimulation Parameters  to tolerance    Electrical Stimulation Goals  Tone;Pain      Manual Therapy   Soft tissue mobilization  STM and TPR to Rt glute med/min, piriformis, obturator internus, and Rt lumbar paraspinals.  Edge tool assistance to Rt lateral thigh (mid to prox) to decrease fascial tightness (limited tolerance)     Myofascial Release  Rt TFL/lateral proximal thigh; Rt glute med        PT Long Term Goals - 06/10/17 1246      PT LONG TERM GOAL #1   Title  Improve posture and alignment with patient to demonstrate good upright posture with hips extended and knees out of hyperextension. 06/23/17    Time  20    Period  Weeks    Status  Achieved      PT LONG TERM GOAL #2   Title  Improve joint mobility and soft tissue extensibility in bilat LE's and trunk with patient demonstrating good joint ROM bilat hips 06/23/17    Time  20    Period  Weeks  Status  On-going      PT LONG TERM GOAL #3   Title  Patient reports 70-90% decrease in hip/LE pain 06/23/17    Time  20    Period  Weeks    Status  Achieved      PT LONG TERM GOAL #4   Title  Independent in HEP with focus on appropriate stretching 06/23/17    Time  20    Status  Partially Met      PT LONG TERM GOAL #5   Title  Improve FOTO to </= 30% limitation 06/23/17    Time  20    Period  Weeks    Status  On-going            Plan - 06/18/17 1601    Clinical Impression Statement  Pt remains tight in Rt lower quadrant and lateral hip/thigh.  Pt's pelvis appeared symmetrical.  Good release of fascial tightness with manual therapy.  Pt reported minimal change in symptoms with ionto patch, so this wasn't repeated.      Rehab Potential  Good    PT Frequency  1x / week    PT Duration  8 weeks    PT Treatment/Interventions  Patient/family education;ADLs/Self Care Home Management;Cryotherapy;Electrical Stimulation;Iontophoresis 69m/ml Dexamethasone;Moist Heat;Ultrasound;Dry needling;Manual techniques;Neuromuscular  re-education;Therapeutic activities;Therapeutic exercise    PT Next Visit Plan  end of POC. assess goals and progress.     Consulted and Agree with Plan of Care  Patient       Patient will benefit from skilled therapeutic intervention in order to improve the following deficits and impairments:  Postural dysfunction, Improper body mechanics, Pain, Increased muscle spasms, Impaired perceived functional ability, Hypomobility, Decreased mobility, Decreased range of motion, Decreased activity tolerance  Visit Diagnosis: Pain in right hip  Other symptoms and signs involving the musculoskeletal system     Problem List Patient Active Problem List   Diagnosis Date Noted  . Primary osteoarthritis of both hips 01/20/2017  . Chronic right shoulder pain 01/20/2017  . Cough 08/19/2014  . Pertussis exposure 08/19/2014  . 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 ALLERGIC RHINITIS 05/29/2009  . BACK PAIN, THORACIC REGION 03/15/2009  . FOOT PAIN, LEFT 01/18/2009  . IRON DEFICIENCY 11/16/2007  . DIZZINESS 11/16/2007   JKerin Perna PTA 06/18/17 4:55 PM  [place] [address]Phone: [phone]  Fax:  [phone] Name: Julia UberMRN: [id number] of Birth: 107/24/1972

## 2017-06-24 ENCOUNTER — Encounter: Payer: Self-pay | Admitting: Rehabilitative and Restorative Service Providers"

## 2017-06-24 ENCOUNTER — Ambulatory Visit (INDEPENDENT_AMBULATORY_CARE_PROVIDER_SITE_OTHER): Payer: 59 | Admitting: Rehabilitative and Restorative Service Providers"

## 2017-06-24 DIAGNOSIS — M25552 Pain in left hip: Secondary | ICD-10-CM

## 2017-06-24 DIAGNOSIS — R29898 Other symptoms and signs involving the musculoskeletal system: Secondary | ICD-10-CM

## 2017-06-24 DIAGNOSIS — M25551 Pain in right hip: Secondary | ICD-10-CM
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
## 2017-06-30 ENCOUNTER — Ambulatory Visit (INDEPENDENT_AMBULATORY_CARE_PROVIDER_SITE_OTHER): Payer: 59 | Admitting: Physical Therapy

## 2017-06-30 DIAGNOSIS — M25551 Pain in right hip: Secondary | ICD-10-CM

## 2017-06-30 DIAGNOSIS — R29898 Other symptoms and signs involving the musculoskeletal system: Secondary | ICD-10-CM
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
## 2017-07-07 ENCOUNTER — Ambulatory Visit (INDEPENDENT_AMBULATORY_CARE_PROVIDER_SITE_OTHER): Payer: 59 | Admitting: Rehabilitative and Restorative Service Providers"

## 2017-07-07 ENCOUNTER — Encounter: Payer: Self-pay | Admitting: Rehabilitative and Restorative Service Providers"

## 2017-07-07 DIAGNOSIS — M79601 Pain in right arm: Secondary | ICD-10-CM

## 2017-07-07 DIAGNOSIS — M25552 Pain in left hip: Secondary | ICD-10-CM | POA: Diagnosis not present

## 2017-07-07 DIAGNOSIS — R29898 Other symptoms and signs involving the musculoskeletal system: Secondary | ICD-10-CM | POA: Diagnosis not present

## 2017-07-07 DIAGNOSIS — M25551 Pain in right hip: Secondary | ICD-10-CM | POA: Diagnosis not present
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
## 2017-07-07 NOTE — Addendum Note (Signed)
Addended by: Val RilesHOLT, Aviana Shevlin P on: 07/07/2017 01:53 PM   Modules accepted: Orders

## 2017-07-14 ENCOUNTER — Ambulatory Visit (INDEPENDENT_AMBULATORY_CARE_PROVIDER_SITE_OTHER): Payer: 59 | Admitting: Physical Therapy

## 2017-07-14 DIAGNOSIS — M25551 Pain in right hip: Secondary | ICD-10-CM | POA: Diagnosis not present

## 2017-07-14 DIAGNOSIS — R29898 Other symptoms and signs involving the musculoskeletal system: Secondary | ICD-10-CM
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
## 2019-02-24 ENCOUNTER — Ambulatory Visit (INDEPENDENT_AMBULATORY_CARE_PROVIDER_SITE_OTHER): Payer: Managed Care, Other (non HMO) | Admitting: Sports Medicine

## 2019-02-24 ENCOUNTER — Other Ambulatory Visit: Payer: Self-pay

## 2019-02-24 ENCOUNTER — Encounter: Payer: Self-pay | Admitting: Sports Medicine

## 2019-02-24 DIAGNOSIS — M160 Bilateral primary osteoarthritis of hip: Secondary | ICD-10-CM | POA: Diagnosis not present

## 2019-02-24 MED ORDER — CELECOXIB 200 MG PO CAPS: ORAL_CAPSULE | ORAL | 2 refills | Status: DC

## 2019-02-24 NOTE — Assessment & Plan Note (Signed)
This pleasant 49 year old female returns, she has bilateral hip osteoarthritis, right worse than left. She has been evaluated by Dr. Caswell Corwin and did have an MR arthrogram back in 2013 that showed osteoarthritis as well as evidence of femoral acetabular impingement. She did not have a good experience with the arthrogram and is very gunshot with regards to injections. She has also wanted to do more of a nonpharmacologic approach so we have not used any medications though I did call in Celebrex at the last visit. Per her request we are going to proceed with additional physical therapy with dry needling, she is going to try Celebrex at this time, I would like to see her back in 4 weeks and we will do a hip joint injection if no better.

## 2019-02-24 NOTE — Progress Notes (Signed)
    Procedures performed today:    None.  Independent interpretation of tests performed by another provider:   Hip MR arthrogram from [facility] from 2013 shows subchondral cystic changes consistent with OA as well as some signs of femoral acetabular impingement.  Impression and Recommendations:    Primary osteoarthritis of both hips This pleasant 49 year old female returns, she has bilateral hip osteoarthritis, right worse than left. She has been evaluated by Dr. Caswell Corwin and did have an MR arthrogram back in 2013 that showed osteoarthritis as well as evidence of femoral acetabular impingement. She did not have a good experience with the arthrogram and is very gunshot with regards to injections. She has also wanted to do more of a nonpharmacologic approach so we have not used any medications though I did call in Celebrex at the last visit. Per her request we are going to proceed with additional physical therapy with dry needling, she is going to try Celebrex at this time, I would like to see her back in 4 weeks and we will do a hip joint injection if no better.    ___________________________________________ Ihor Austin. Benjamin Stain, M.D., ABFM., CAQSM. Primary Care and Sports Medicine [facility]  Adjunct Instructor of Family Medicine  [employer]

## 2019-02-24 NOTE — Patient Instructions (Signed)
We are starting Celebrex today. If failure of Celebrex and physical therapy we will talk about a hip joint steroid injection with a combination of lidocaine, bupivacaine, and triamcinolone delivered with ultrasound guidance.

## 2019-02-25 ENCOUNTER — Ambulatory Visit: Payer: 59 | Admitting: Sports Medicine

## 2019-03-01 ENCOUNTER — Ambulatory Visit (INDEPENDENT_AMBULATORY_CARE_PROVIDER_SITE_OTHER): Payer: Managed Care, Other (non HMO) | Admitting: Rehabilitative and Restorative Service Providers"

## 2019-03-01 ENCOUNTER — Other Ambulatory Visit: Payer: Self-pay

## 2019-03-01 ENCOUNTER — Encounter: Payer: Self-pay | Admitting: Rehabilitative and Restorative Service Providers"

## 2019-03-01 DIAGNOSIS — M6281 Muscle weakness (generalized): Secondary | ICD-10-CM

## 2019-03-01 DIAGNOSIS — M25551 Pain in right hip: Secondary | ICD-10-CM

## 2019-03-01 DIAGNOSIS — M25552 Pain in left hip: Secondary | ICD-10-CM | POA: Diagnosis not present

## 2019-03-01 DIAGNOSIS — R29898 Other symptoms and signs involving the musculoskeletal system: Secondary | ICD-10-CM

## 2019-03-01 NOTE — Therapy (Signed)
[facility] [place] [address] [place], [place], [address] Phone: [phone]   Fax:  [phone]  Physical Therapy Evaluation  Patient Details  Name: Julia Griffith MRN: [id number] Date of Birth: 1970/03/06 Referring Provider (PT): Monica Becton, MD   Encounter Date: 03/01/2019  PT End of Session - 03/01/19 1357    Visit Number  1    Number of Visits  12    Date for PT Re-Evaluation  04/12/19    PT Start Time  1104    PT Stop Time  1145    PT Time Calculation (min)  41 min       Past Medical History:  Diagnosis Date  . Osteoarthritis of right hip 10/19/2012  . Postpartum depression     Past Surgical History:  Procedure Laterality Date  . LEEP    . Right TM repair    . thumb and middle finger surgery     overuse injury    There were no vitals filed for this visit.   Subjective Assessment - 03/01/19 1105    Subjective  The patient c/o tightness in bilateral hips R greater than L.  She feels tightness inner and outer thigh.  Symptoms are chronic in nature and have worsened over the past 6 months.  The patient has tried water aerobics and is in water 2-3 days per week.    Pertinent History  n/a    Patient Stated Goals  Reduce pain and discomfort; release the muscles    Currently in Pain?  Yes    Pain Score  6    L is 0/10 right now   Pain Location  Hip    Pain Orientation  Right;Left;Medial   R pain greater than the L side; groin area   Pain Descriptors / Indicators  Sharp;Aching;Discomfort;Tightness    Pain Type  Chronic pain    Pain Onset  More than a month ago    Pain Frequency  Constant    Aggravating Factors   walking up stairs (catches and creates a twinge of pain); senses a clicking sensation, when right leg is straight    Pain Relieving Factors  meds         [facility] PT Assessment - 03/01/19 1111      Assessment   Medical Diagnosis  Primary OA of both hips    Referring Provider (PT)   Monica Becton, MD    Onset Date/Surgical Date  --   initial onset 8 years ago;  referral 02/24/19   Next MD Visit  6 week f/u    Prior Therapy  known to our clinic from prior PT      Precautions   Precautions  None      Restrictions   Weight Bearing Restrictions  No      Balance Screen   Has the patient fallen in the past 6 months  No    Has the patient had a decrease in activity level because of a fear of falling?   No    Is the patient reluctant to leave their home because of a fear of falling?   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      Observation/Other Assessments   Focus on Therapeutic Outcomes (FOTO)   64% (36% limitation)      Sensation   Light Touch  Appears Intact      Posture/Postural Control   Posture/Postural Control  No significant limitations      ROM / Strength   AROM / PROM / Strength  AROM;Strength;PROM      AROM   Overall AROM   Deficits    Overall AROM Comments  --    AROM Assessment Site  Lumbar;Hip    Right/Left Hip  Right;Left    Right Hip Flexion  120   can pull knee to chest   Lumbar Flexion  25% limitation     Lumbar Extension  50% limitation   R groin pain   Lumbar - Right Side Bend  WNLs    Lumbar - Left Side Bend  WNLs    Lumbar - Right Rotation  WNLs    Lumbar - Left Rotation  WNLs      PROM   Overall PROM   Deficits    PROM Assessment Site  Hip    Right/Left Hip  Right    Right Hip Flexion  90   patient guards with PROM beyond 90 degrees   Right Hip External Rotation   30   with hip flexed to 90 degrees   Right Hip Internal Rotation   0   with hip  flexed to 90 degrees   Right Hip ABduction  15   with pain     Strength   Overall Strength  Deficits    Overall Strength Comments  --    Strength Assessment Site  Hip    Right/Left Hip  Right;Left    Right Hip Flexion  5/5    Right Hip Extension  4/5    Right Hip ABduction  5/5    Left Hip Flexion  5/5    Left Hip Extension  4/5    Left Hip ABduction  5/5      Flexibility   Soft Tissue Assessment /Muscle Length  yes    Hamstrings  -32 L hamstring, -20 R hamstring (beginning position 90/90 supine)    Quadriceps  significant tightness bilaterally in prone with ovepressure and thomas test + for 25+ degree flexion off of mat table      Palpation   Palpation comment  tender to palpation R hip adductors from groin to medial knee      Special Tests    Special Tests  Hip Special Tests    Other special tests  --    Hip Special Tests   Hip Scouring;Thomas Test;Patrick (FABER) Test;Other      [facility]) Test   Findings  Positive    Side  Right;Left    Comments  Painful R, limited ROM L      Thomas Test    Findings  Positive    Side  Right;Left    Comments  25 + degree flexion during thomas test      Hip Scouring   Findings  Positive    Side  Left    Comments  Painful hip scour L, negative on R side      other   Findings  Positive    Side  Right    Comments  FADIR impingement test      Ambulation/Gait   Ambulation/Gait  Yes    Gait Pattern  --   antalgia R hip                Objective measurements completed on examination: See above findings.      [facility] Adult PT Treatment/Exercise - 03/01/19 1111      Exercises   Exercises  Knee/Hip      Knee/Hip Exercises: Stretches   Hip Flexor Stretch  Right;Left;2 reps;30 seconds    Other Knee/Hip Stretches  Hip adductor stretch standing; hip flexor mobilization on foam roller    Other Knee/Hip Stretches  standing hamstring stretch with modified down dog      Manual Therapy   Manual Therapy  Joint  mobilization    Manual therapy comments  supine     Joint Mobilization  long arm hip distraction             PT Education - 03/01/19 1145    Education Details  HEP    Person(s) Educated  Patient    Methods  Explanation;Demonstration;Handout    Comprehension  Returned demonstration;Verbalized understanding          PT Long Term Goals - 03/01/19 1358      PT LONG TERM GOAL #1   Title  The patient will be independent with HEP for bilateral hip stretching, strengthening, and self mobilization.    Time  6    Period  Weeks    Target Date  04/12/19      PT LONG TERM GOAL #2   Title  The patient will report pain in R hip at rest < or equal to 3/10.    Baseline  6/10 @ baseline.    Time  6    Period  Weeks    Target Date  04/12/19      PT LONG TERM GOAL #3   Title  The patient will report functional limitation per FOTO < or equal to 25%.    Baseline  36% limited at eval    Time  6    Period  Weeks    Target Date  04/12/19      PT LONG TERM GOAL #4   Title  The patient will report no pain in the right hip when negotiating steps.    Time  6    Period  Weeks    Target Date  04/12/19      PT LONG TERM GOAL #5   Title  The patient will improve hip extensor strength to 5/5.    Baseline  4/5 bilaterally    Time  6    Period  Weeks    Target Date  04/12/19             Plan - 03/01/19 1418    Clinical Impression Statement  The patient is a 49 year old female known to our clinic from prior PT for hip pain.  She notes worsening symptoms in the last 6 months with 6/10 resting pain.  She has impairments in hip PROM, AROM, flexibility, soft tissue mobility, strength leading to functional limitations in stair negotiatoin, extended gait, comfort for sleeping.  PT to address deficits to promote dec'd pain and improved mobility.    Personal Factors and Comorbidities  Time since onset of injury/illness/exacerbation    Examination-Activity Limitations  Lift;Squat;Stairs     Stability/Clinical Decision Making  Stable/Uncomplicated    Clinical  Decision Making  Low    Rehab Potential  Good    PT Frequency  2x / week    PT Duration  6 weeks    PT Treatment/Interventions  ADLs/Self Care Home Management;Gait training;Stair training;Functional mobility training;Therapeutic activities;Aquatic Therapy;Cryotherapy;Electrical Stimulation;Iontophoresis 4mg /ml Dexamethasone;Moist Heat;Therapeutic exercise;Balance training;Neuromuscular re-education;Manual techniques;Dry needling;Taping;Patient/family education    PT Next Visit Plan  self distraction R hip, dry needling, STM hip adductor and hip flexors,  hip extensor strengthening, joint mobilization and distraction    Consulted and Agree with Plan of Care  Patient       Patient will benefit from skilled therapeutic intervention in order to improve the following deficits and impairments:  Pain, Decreased strength, Decreased range of motion, Impaired flexibility, Hypomobility  Visit Diagnosis: Pain in right hip  Other symptoms and signs involving the musculoskeletal system  Pain in left hip  Muscle weakness (generalized)     Problem List Patient Active Problem List   Diagnosis Date Noted  . Primary osteoarthritis of both hips 01/20/2017  . Chronic right shoulder pain 01/20/2017  . Cough 08/19/2014  . Pertussis exposure 08/19/2014  . 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 ALLERGIC RHINITIS 05/29/2009  . BACK PAIN, THORACIC REGION 03/15/2009  . FOOT PAIN, LEFT 01/18/2009  . IRON DEFICIENCY 11/16/2007  . DIZZINESS 11/16/2007    [place] , PT 03/01/2019, 8:20 PM  [facility] [place], [place], [address] Phone: [phone]   Fax:  [phone]  Name: Teia Freitas MRN: [id number] Date of Birth: 1970-11-05

## 2019-03-01 NOTE — Patient Instructions (Signed)
Access Code: [email or web address]  URL: [email or web address]  Date: 03/01/2019  Prepared by: Margretta Ditty   Exercises Side Lunge Adductor Stretch - 3 reps - 1 sets - 20-30 seconds hold - 2x daily - 7x weekly Thomas Stretch on Table - 3 reps - 1 sets - 30 seconds hold - 2x daily - 7x weekly Hip Flexor Mobilization with Foam Roll - 10 reps - 1 sets - 2x daily                            - 7x weekly

## 2019-03-05 ENCOUNTER — Encounter: Payer: Self-pay | Admitting: Rehabilitative and Restorative Service Providers"

## 2019-03-05 ENCOUNTER — Ambulatory Visit (INDEPENDENT_AMBULATORY_CARE_PROVIDER_SITE_OTHER): Payer: Managed Care, Other (non HMO) | Admitting: Rehabilitative and Restorative Service Providers"

## 2019-03-05 ENCOUNTER — Other Ambulatory Visit: Payer: Self-pay

## 2019-03-05 DIAGNOSIS — M25552 Pain in left hip: Secondary | ICD-10-CM | POA: Diagnosis not present

## 2019-03-05 DIAGNOSIS — M25551 Pain in right hip: Secondary | ICD-10-CM

## 2019-03-05 DIAGNOSIS — M6281 Muscle weakness (generalized): Secondary | ICD-10-CM | POA: Diagnosis not present

## 2019-03-05 DIAGNOSIS — R29898 Other symptoms and signs involving the musculoskeletal system: Secondary | ICD-10-CM

## 2019-03-05 NOTE — Patient Instructions (Signed)
Reviewed HEP - stretching

## 2019-03-05 NOTE — Therapy (Signed)
[place], [place], [address] Phone: [phone]   Fax:  [phone]  Physical Therapy Treatment  Patient Details  Name: Julia Griffith MRN: [id number] Date of Birth: 03/16/1970 Referring Provider (PT): Silverio Decamp, MD   Encounter Date: 03/05/2019  PT End of Session - 03/05/19 0932    Visit Number  2    Number of Visits  12    Date for PT Re-Evaluation  04/12/19    PT Start Time  0931    PT Stop Time  1016    PT Time Calculation (min)  45 min    Activity Tolerance  Patient tolerated treatment well       Past Medical History:  Diagnosis Date  . Osteoarthritis of right hip 10/19/2012  . Postpartum depression     Past Surgical History:  Procedure Laterality Date  . LEEP    . Right TM repair    . thumb and middle finger surgery     overuse injury    There were no vitals filed for this visit.  Subjective Assessment - 03/05/19 0935    Subjective  I'm just locked up again    Currently in Pain?  Yes    Pain Score  6     Pain Location  Hip    Pain Orientation  Right;Left;Medial    Pain Descriptors / Indicators  Aching    Pain Type  Chronic pain                       [facility] Adult PT Treatment/Exercise - 03/05/19 0001      Moist Heat Therapy   Number Minutes Moist Heat  10 Minutes    Moist Heat Location  Hip   anterior Rt     Manual Therapy   Manual Therapy  Soft tissue mobilization;Joint mobilization;Myofascial release    Manual therapy comments  supine     Soft tissue mobilization  deep tissue work through Rt adductors and hip flexors     Myofascial Release  Rt anterior hip/thigh     Other Manual Therapy  skilled palpation of tissues for DN        Trigger Point Dry Needling - 03/05/19 0001    Consent Given?  Yes    Education Handout Provided  Previously provided    Dry Needling Comments  pt supine; Rt LE only     Other Dry Needling  sartorius Rt  increased palpable tightness     Quadriceps Response  Palpable increased muscle length;Twitch response elicited    Adductor Response  Palpable increased muscle length;Twitch response elicited           PT Education - 03/05/19 1015    Education Details  DN; HEP; rehab process; exercises program    Person(s) Educated  Patient    Methods  Explanation    Comprehension  Verbalized understanding          PT Long Term Goals - 03/01/19 1358      PT LONG TERM GOAL #1   Title  The patient will be independent with HEP for bilateral hip stretching, strengthening, and self mobilization.    Time  6    Period  Weeks    Target Date  04/12/19      PT LONG TERM GOAL #2   Title  The patient will report pain in R hip at rest < or equal to 3/10.    Baseline  6/10 @ baseline.    Time  6    Period  Weeks    Target Date  04/12/19      PT LONG TERM GOAL #3   Title  The patient will report functional limitation per FOTO < or equal to 25%.    Baseline  36% limited at eval    Time  6    Period  Weeks    Target Date  04/12/19      PT LONG TERM GOAL #4   Title  The patient will report no pain in the right hip when negotiating steps.    Time  6    Period  Weeks    Target Date  04/12/19      PT LONG TERM GOAL #5   Title  The patient will improve hip extensor strength to 5/5.    Baseline  4/5 bilaterally    Time  6    Period  Weeks    Target Date  04/12/19            Plan - 03/05/19 1016    Clinical Impression Statement  Patient reports continued tightness and discomfort Rt anterior hip/inner thighs. She has palpable tightness through Rt hip adductors and flexors. Good response to DN. Discussed exercise program patient has previously had success with and she will begin the "21 day fix" again.    Rehab Potential  Good    PT Frequency  2x / week    PT Duration  6 weeks    PT Treatment/Interventions  ADLs/Self Care Home Management;Gait training;Stair training;Functional mobility  training;Therapeutic activities;Aquatic Therapy;Cryotherapy;Electrical Stimulation;Iontophoresis 4mg /ml Dexamethasone;Moist Heat;Therapeutic exercise;Balance training;Neuromuscular re-education;Manual techniques;Dry needling;Taping;Patient/family education    PT Next Visit Plan  self distraction R hip, dry needling, STM hip adductor and hip flexors,  hip extensor strengthening, joint mobilization and distraction    Consulted and Agree with Plan of Care  Patient       Patient will benefit from skilled therapeutic intervention in order to improve the following deficits and impairments:     Visit Diagnosis: Pain in right hip  Other symptoms and signs involving the musculoskeletal system  Pain in left hip  Muscle weakness (generalized)     Problem List Patient Active Problem List   Diagnosis Date Noted  . Primary osteoarthritis of both hips 01/20/2017  . Chronic right shoulder pain 01/20/2017  . Cough 08/19/2014  . Pertussis exposure 08/19/2014  . 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 ALLERGIC RHINITIS 05/29/2009  . BACK PAIN, THORACIC REGION 03/15/2009  . FOOT PAIN, LEFT 01/18/2009  . IRON DEFICIENCY 11/16/2007  . DIZZINESS 11/16/2007    Elisheva Fallas 13/09/2007 PT, MPH  03/05/2019, 10:18 AM  [name] [address] [place], [place], [place] Phone: [phone]   Fax:  [phone]  Name: Julia Griffith MRN: Larita Fife Date of Birth: 17-Mar-1970

## 2019-03-08 ENCOUNTER — Ambulatory Visit (INDEPENDENT_AMBULATORY_CARE_PROVIDER_SITE_OTHER): Payer: Managed Care, Other (non HMO) | Admitting: Rehabilitative and Restorative Service Providers"

## 2019-03-08 ENCOUNTER — Other Ambulatory Visit: Payer: Self-pay

## 2019-03-08 ENCOUNTER — Encounter: Payer: Self-pay | Admitting: Rehabilitative and Restorative Service Providers"

## 2019-03-08 DIAGNOSIS — R29898 Other symptoms and signs involving the musculoskeletal system: Secondary | ICD-10-CM

## 2019-03-08 DIAGNOSIS — M25552 Pain in left hip: Secondary | ICD-10-CM | POA: Diagnosis not present

## 2019-03-08 DIAGNOSIS — M25551 Pain in right hip: Secondary | ICD-10-CM | POA: Diagnosis not present

## 2019-03-08 DIAGNOSIS — M6281 Muscle weakness (generalized): Secondary | ICD-10-CM

## 2019-03-08 NOTE — Patient Instructions (Signed)
Access Code: [email or web address]  URL: [email or web address]  Date: 03/08/2019  Prepared by: Margretta Ditty   Program Notes  HIP DISTRACTION: with band in door, move into quadriped and loop the R foot through band. Allow band to pull to create space at the hip.   Exercises Side Lunge Adductor Stretch - 3 reps - 1 sets - 20-30 seconds hold - 2x daily - 7x weekly Thomas Stretch on Table - 3 reps - 1 sets - 30 seconds hold - 2x daily - 7x weekly Hip Flexor Mobilization with Foam Roll - 10 reps - 1 sets - 2x daily                            - 7x weekly Seated Table Piriformis Stretch - 10 reps - 1 sets - 2x daily - 7x weekly Supine Bridge - 10 reps - 1 sets - 2x daily - 7x weekly Supine Hip Adductor Stretch - 3 reps - 1 sets - 20 seconds hold - 2x daily - 7x weekly

## 2019-03-09 NOTE — Therapy (Signed)
[facility] [place] [address] [place], [place], [address] Phone: [phone]   Fax:  [phone]  Physical Therapy Treatment  Patient Details  Name: Julia Griffith MRN: [id number] Date of Birth: 1970-07-29 Referring Provider (PT): Monica Becton, MD   Encounter Date: 03/08/2019  PT End of Session - 03/08/19 2129    Visit Number  3    Number of Visits  12    Date for PT Re-Evaluation  04/12/19    PT Start Time  1520    PT Stop Time  1605    PT Time Calculation (min)  45 min    Activity Tolerance  Patient tolerated treatment well       Past Medical History:  Diagnosis Date  . Osteoarthritis of right hip 10/19/2012  . Postpartum depression     Past Surgical History:  Procedure Laterality Date  . LEEP    . Right TM repair    . thumb and middle finger surgery     overuse injury    There were no vitals filed for this visit.  Subjective Assessment - 03/08/19 1522    Subjective  The patient is sore from activities that she has been doing like yard work, and exercise.  She was sore after dry needling last visit. She demonstrates it is harder to place R ankle to L knee.    Patient Stated Goals  Reduce pain and discomfort; release the muscles    Currently in Pain?  Yes    Pain Score  6     Pain Location  Hip    Pain Orientation  Right;Left   R hip greater than L   Pain Descriptors / Indicators  Aching;Sore    Pain Type  Chronic pain    Pain Onset  More than a month ago    Pain Frequency  Constant    Aggravating Factors   walking up stairs,    Pain Relieving Factors  meds                       [facility] Adult PT Treatment/Exercise - 03/08/19 2130      Exercises   Exercises  Knee/Hip      Knee/Hip Exercises: Stretches   Quad Stretch  Right;Left;2 reps;30 seconds    Piriformis Stretch  Right;1 rep;20 seconds    Piriformis Stretch Limitations  Performed seated stretch with patient noting firm  end feel     Other Knee/Hip Stretches  Supine hip adductor stretch supine in hooklying position x 2 reps R and L sides.    Other Knee/Hip Stretches  Trialed positions for hip self distraction.  1) Quadriped with black theraband looped around R inner thigh with lateral pull.  Patient moved anterior/left to increase distraction.  Not able to get comfortable distraction.  2)  Supine on yoga mat on the floor with R foot looped in black theraband and scooting away to create distraction force at the right hip.  3) patient felt lying quadriped with foot in black band would feel best.  We trialed this position and held for 2 minutes and then performed hip hike/depression for self distraction.  Patient felt most relief with 3rd position for self hip distraction.      Knee/Hip Exercises: Aerobic   Nustep  Level 4 x 5 minutes LEs      Knee/Hip Exercises: Standing   Other Standing Knee Exercises  standing on 4" step with 5lb weight R LE with  small amplitude hip flexion/extension for distraction.  Performed hip hike/depression with weight.      Knee/Hip Exercises: Supine   Bridges  Strengthening;Both;10 reps    Other Supine Knee/Hip Exercises  Patient performed physioball bridge with knee fleixon/extension earlier today as part of workout.  We discussed simple bridge working on lengthening through anterior hip and focusing on full ROM with hip extensor strengthening.    Other Supine Knee/Hip Exercises  PROM and AROM R hip into flexion, gentle PROM IR in supine and prone.      Knee/Hip Exercises: Prone   Other Prone Exercises  Quadriped hip extension x 5 reps R and L.      Manual Therapy   Manual Therapy  Soft tissue mobilization    Soft tissue mobilization  Patient continues with muscle tightness and spasms of R hip adductors.  PT performed STM with stretching to tolerance *point tender today.                  PT Long Term Goals - 03/01/19 1358      PT LONG TERM GOAL #1   Title  The patient  will be independent with HEP for bilateral hip stretching, strengthening, and self mobilization.    Time  6    Period  Weeks    Target Date  04/12/19      PT LONG TERM GOAL #2   Title  The patient will report pain in R hip at rest < or equal to 3/10.    Baseline  6/10 @ baseline.    Time  6    Period  Weeks    Target Date  04/12/19      PT LONG TERM GOAL #3   Title  The patient will report functional limitation per FOTO < or equal to 25%.    Baseline  36% limited at eval    Time  6    Period  Weeks    Target Date  04/12/19      PT LONG TERM GOAL #4   Title  The patient will report no pain in the right hip when negotiating steps.    Time  6    Period  Weeks    Target Date  04/12/19      PT LONG TERM GOAL #5   Title  The patient will improve hip extensor strength to 5/5.    Baseline  4/5 bilaterally    Time  6    Period  Weeks    Target Date  04/12/19            Plan - 03/09/19 2143    Clinical Impression Statement  The patient continues with significant pain and tightness in R anterior hip and R groin.  She has muscle trigger points and spasm in R hip adductor.  PT focused on stretching, self mobilization, and strengthening working to reduce pain and improve moiblity.    Rehab Potential  Good    PT Frequency  2x / week    PT Duration  6 weeks    PT Treatment/Interventions  ADLs/Self Care Home Management;Gait training;Stair training;Functional mobility training;Therapeutic activities;Aquatic Therapy;Cryotherapy;Electrical Stimulation;Iontophoresis 4mg /ml Dexamethasone;Moist Heat;Therapeutic exercise;Balance training;Neuromuscular re-education;Manual techniques;Dry needling;Taping;Patient/family education    PT Next Visit Plan  self distraction R hip, dry needling, STM hip adductor and hip flexors,  hip extensor strengthening, joint mobilization and distraction    Consulted and Agree with Plan of Care  Patient       Patient will benefit from  skilled therapeutic  intervention in order to improve the following deficits and impairments:  Pain, Decreased strength, Decreased range of motion, Impaired flexibility, Hypomobility  Visit Diagnosis: Pain in right hip  Other symptoms and signs involving the musculoskeletal system  Pain in left hip  Muscle weakness (generalized)     Problem List Patient Active Problem List   Diagnosis Date Noted  . Primary osteoarthritis of both hips 01/20/2017  . Chronic right shoulder pain 01/20/2017  . Cough 08/19/2014  . Pertussis exposure 08/19/2014  . 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 ALLERGIC RHINITIS 05/29/2009  . BACK PAIN, THORACIC REGION 03/15/2009  . FOOT PAIN, LEFT 01/18/2009  . IRON DEFICIENCY 11/16/2007  . DIZZINESS 11/16/2007    Aristotelis Vilardi, PT 03/09/2019, 9:45 PM  [facility] [place], [place], [address] Phone: [phone]   Fax:  [phone]  Name: Shawntelle Ungar MRN: [id number] Date of Birth: [birth year]

## 2019-03-10 ENCOUNTER — Ambulatory Visit (INDEPENDENT_AMBULATORY_CARE_PROVIDER_SITE_OTHER): Payer: Managed Care, Other (non HMO) | Admitting: Physical Therapy

## 2019-03-10 ENCOUNTER — Encounter: Payer: Self-pay | Admitting: Physical Therapy

## 2019-03-10 ENCOUNTER — Other Ambulatory Visit: Payer: Self-pay

## 2019-03-10 DIAGNOSIS — R29898 Other symptoms and signs involving the musculoskeletal system: Secondary | ICD-10-CM | POA: Diagnosis not present

## 2019-03-10 DIAGNOSIS — M6281 Muscle weakness (generalized): Secondary | ICD-10-CM

## 2019-03-10 DIAGNOSIS — M25551 Pain in right hip: Secondary | ICD-10-CM | POA: Diagnosis not present

## 2019-03-10 DIAGNOSIS — M25552 Pain in left hip: Secondary | ICD-10-CM | POA: Diagnosis not present

## 2019-03-10 NOTE — Therapy (Signed)
[place], [place], [address] Phone: [phone]   Fax:  [phone]  Physical Therapy Treatment  Patient Details  Name: Julia Griffith MRN: [id number] Date of Birth: Apr 03, 1970 Referring Provider (PT): Silverio Decamp, MD   Encounter Date: 03/10/2019  PT End of Session - 03/10/19 0802    Visit Number  4    Number of Visits  12    Date for PT Re-Evaluation  04/12/19    PT Start Time  0803    PT Stop Time  0855    PT Time Calculation (min)  52 min    Activity Tolerance  Patient tolerated treatment well    Behavior During Therapy  [facility] for tasks assessed/performed       Past Medical History:  Diagnosis Date  . Osteoarthritis of right hip 10/19/2012  . Postpartum depression     Past Surgical History:  Procedure Laterality Date  . LEEP    . Right TM repair    . thumb and middle finger surgery     overuse injury    There were no vitals filed for this visit.  Subjective Assessment - 03/10/19 0805    Subjective  Pt felt like the distraction of the hip helped as did DN.  Feels like she needs more DN    Currently in Pain?  Yes    Pain Score  5     Pain Location  Hip    Pain Orientation  Right    Pain Descriptors / Indicators  Aching    Pain Type  Chronic pain    Pain Onset  More than a month ago    Pain Frequency  Constant                       [facility] Adult PT Treatment/Exercise - 03/10/19 0001      Knee/Hip Exercises: Stretches   Hip Flexor Stretch  Both;1 rep;30 seconds   in high lunge   Hip Flexor Stretch Limitations  standing adductor stetch in side lunge      Knee/Hip Exercises: Aerobic   Nustep  L6x5'      Knee/Hip Exercises: Supine   Other Supine Knee/Hip Exercises  self hip distraction using yoga strap and door on the floor, pushing away with Lt LE       Modalities   Modalities  Moist Heat      Moist Heat Therapy   Number Minutes Moist Heat  12  Minutes    Moist Heat Location  --   Rt thigh     Manual Therapy   Manual Therapy  Joint mobilization;Soft tissue mobilization;Manual Traction    Manual therapy comments  skilled palpation and monitoring during DN    Joint Mobilization  Rt hip AP and PA mobs grade I -II respecting pt pain level     Soft tissue mobilization  STM to Rt inner thigh and quad    Manual Traction  long leg distraction       Trigger Point Dry Needling - 03/10/19 0001    Consent Given?  Yes    Education Handout Provided  Previously provided    Electrical Stimulation Performed with Dry Needling  Yes    E-stim with Dry Needling Details  CPS 2 up to tolerance x 10'    Other Dry Needling  sartorius Rt increased palpable tightness     Quadriceps Response  Twitch response elicited;Palpable increased muscle length  vmo   Adductor Response  Twitch response elicited;Palpable increased muscle length   rt               PT Long Term Goals - 03/01/19 1358      PT LONG TERM GOAL #1   Title  The patient will be independent with HEP for bilateral hip stretching, strengthening, and self mobilization.    Time  6    Period  Weeks    Target Date  04/12/19      PT LONG TERM GOAL #2   Title  The patient will report pain in R hip at rest < or equal to 3/10.    Baseline  6/10 @ baseline.    Time  6    Period  Weeks    Target Date  04/12/19      PT LONG TERM GOAL #3   Title  The patient will report functional limitation per FOTO < or equal to 25%.    Baseline  36% limited at eval    Time  6    Period  Weeks    Target Date  04/12/19      PT LONG TERM GOAL #4   Title  The patient will report no pain in the right hip when negotiating steps.    Time  6    Period  Weeks    Target Date  04/12/19      PT LONG TERM GOAL #5   Title  The patient will improve hip extensor strength to 5/5.    Baseline  4/5 bilaterally    Time  6    Period  Weeks    Target Date  04/12/19            Plan - 03/10/19 0845     Clinical Impression Statement  Anea had good releases with DN and use of stim with it.  She was able to stretch a little further afterward. Would benefit from continued tx to reduce tightness and increase joint space.    Rehab Potential  Good    PT Frequency  2x / week    PT Duration  6 weeks    PT Treatment/Interventions  ADLs/Self Care Home Management;Gait training;Stair training;Functional mobility training;Therapeutic activities;Aquatic Therapy;Cryotherapy;Electrical Stimulation;Iontophoresis 4mg /ml Dexamethasone;Moist Heat;Therapeutic exercise;Balance training;Neuromuscular re-education;Manual techniques;Dry needling;Taping;Patient/family education    PT Next Visit Plan  self distraction R hip, dry needling, STM hip adductor and hip flexors,  hip extensor strengthening, joint mobilization and distraction    Consulted and Agree with Plan of Care  Patient       Patient will benefit from skilled therapeutic intervention in order to improve the following deficits and impairments:  Pain, Decreased strength, Decreased range of motion, Impaired flexibility, Hypomobility  Visit Diagnosis: Pain in right hip  Other symptoms and signs involving the musculoskeletal system  Pain in left hip  Muscle weakness (generalized)     Problem List Patient Active Problem List   Diagnosis Date Noted  . Primary osteoarthritis of both hips 01/20/2017  . Chronic right shoulder pain 01/20/2017  . Cough 08/19/2014  . Pertussis exposure 08/19/2014  . 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 ALLERGIC RHINITIS 05/29/2009  . BACK PAIN, THORACIC REGION 03/15/2009  . FOOT PAIN, LEFT 01/18/2009  . IRON DEFICIENCY 11/16/2007  . DIZZINESS 11/16/2007    13/09/2007 rPT  03/10/2019, 8:48 AM  [facility] [address] [place], [place], [place] Phone: [phone]   Fax:  [phone]  Name:  Julia Griffith MRN: [id number] Date of Birth:  11-16-1970

## 2019-03-15 ENCOUNTER — Ambulatory Visit (INDEPENDENT_AMBULATORY_CARE_PROVIDER_SITE_OTHER): Payer: Managed Care, Other (non HMO) | Admitting: Rehabilitative and Restorative Service Providers"

## 2019-03-15 ENCOUNTER — Other Ambulatory Visit: Payer: Self-pay

## 2019-03-15 ENCOUNTER — Encounter: Payer: Self-pay | Admitting: Rehabilitative and Restorative Service Providers"

## 2019-03-15 DIAGNOSIS — M25552 Pain in left hip: Secondary | ICD-10-CM

## 2019-03-15 DIAGNOSIS — M79601 Pain in right arm: Secondary | ICD-10-CM

## 2019-03-15 DIAGNOSIS — M25551 Pain in right hip: Secondary | ICD-10-CM

## 2019-03-15 DIAGNOSIS — R29898 Other symptoms and signs involving the musculoskeletal system: Secondary | ICD-10-CM

## 2019-03-15 DIAGNOSIS — M6281 Muscle weakness (generalized): Secondary | ICD-10-CM | POA: Diagnosis not present

## 2019-03-15 NOTE — Therapy (Signed)
[facility] [place] [address] [place], [place], [address] Phone: [phone]   Fax:  [phone]  Physical Therapy Treatment  Patient Details  Name: Julia Griffith MRN: [id number] Date of Birth: Mar 12, 1970 Referring Provider (PT): Monica Becton, MD   Encounter Date: 03/15/2019  PT End of Session - 03/15/19 0846    Visit Number  5    Number of Visits  12    Date for PT Re-Evaluation  04/12/19    PT Start Time  0845    PT Stop Time  0930    PT Time Calculation (min)  45 min    Activity Tolerance  Patient tolerated treatment well       Past Medical History:  Diagnosis Date  . Osteoarthritis of right hip 10/19/2012  . Postpartum depression     Past Surgical History:  Procedure Laterality Date  . LEEP    . Right TM repair    . thumb and middle finger surgery     overuse injury    There were no vitals filed for this visit.  Subjective Assessment - 03/15/19 0849    Subjective  Patient reports that she is feeling better following DN - responds well to the DN    Currently in Pain?  Yes    Pain Score  5     Pain Location  Hip    Pain Orientation  Right    Pain Descriptors / Indicators  Aching    Pain Type  Chronic pain         [facility] PT Assessment - 03/15/19 0001      Assessment   Medical Diagnosis  Primary OA of both hips    Referring Provider (PT)  Monica Becton, MD    Onset Date/Surgical Date  --   initial onset 8 years ago;  referral 02/24/19   Hand Dominance  Right    Next MD Visit  6 week f/u    Prior Therapy  known to our clinic from prior PT      AROM   Overall AROM Comments  persistent tightness and limited ROM in rotation Rt hip; tight hip flexors       PROM   Right Hip Internal Rotation   10   prone with hip flexed to 90 degrees     Palpation   Palpation comment  tender to palpation R posterior hip musculature as well as adductors from groin to medial knee; ITB                     [facility] Adult PT Treatment/Exercise - 03/15/19 0001      Knee/Hip Exercises: Stretches   Lobbyist  Right;1 rep;30 seconds   PT assist    Hip Flexor Stretch  Both;1 rep;30 seconds   in high lunge   Piriformis Stretch  Right;1 rep;20 seconds    Other Knee/Hip Stretches  Supine hip adductor stretch supine in hooklying position x 2 reps R       Moist Heat Therapy   Number Minutes Moist Heat  10 Minutes    Moist Heat Location  Lumbar Spine   Rt thigh     Manual Therapy   Manual Therapy  Joint mobilization;Soft tissue mobilization;Manual Traction    Manual therapy comments  skilled palpation and monitoring during DN    Joint Mobilization  Rt hip AP and PA mobs grade II/III    Soft tissue mobilization  STM to Rt posterior hip;  anterior hip; inner thigh and quad    Myofascial Release  Rt posterior hip        Trigger Point Dry Needling - 03/15/19 0001    Consent Given?  Yes    Education Handout Provided  Previously provided    Dry Needling Comments  pt prone and supine; Rt LE only     Other Dry Needling  sartorius Rt increased palpable tightness     Adductor Response  Twitch response elicited;Palpable increased muscle length   rt   Rectus femoris Response  Palpable increased muscle length    Gluteus Medius Response  Palpable increased muscle length    Gluteus Maximus Response  Palpable increased muscle length    Piriformis Response  Palpable increased muscle length    Tensor Fascia Lata Response  Palpable increased muscle length           PT Education - 03/15/19 0927    Education Details  reviewed HEP and myofacial ball release; TENS at home    Person(s) Educated  Patient    Methods  Explanation    Comprehension  Verbalized understanding          PT Long Term Goals - 03/01/19 1358      PT LONG TERM GOAL #1   Title  The patient will be independent with HEP for bilateral hip stretching, strengthening, and self mobilization.    Time  6     Period  Weeks    Target Date  04/12/19      PT LONG TERM GOAL #2   Title  The patient will report pain in R hip at rest < or equal to 3/10.    Baseline  6/10 @ baseline.    Time  6    Period  Weeks    Target Date  04/12/19      PT LONG TERM GOAL #3   Title  The patient will report functional limitation per FOTO < or equal to 25%.    Baseline  36% limited at eval    Time  6    Period  Weeks    Target Date  04/12/19      PT LONG TERM GOAL #4   Title  The patient will report no pain in the right hip when negotiating steps.    Time  6    Period  Weeks    Target Date  04/12/19      PT LONG TERM GOAL #5   Title  The patient will improve hip extensor strength to 5/5.    Baseline  4/5 bilaterally    Time  6    Period  Weeks    Target Date  04/12/19            Plan - 03/15/19 4742    Clinical Impression Statement  Patient reports that she had good response to DN from last visit. Continues to work on exercises at home. Good response to Dn and manual work with assisted stretching.    Rehab Potential  Good    PT Frequency  2x / week    PT Duration  6 weeks    PT Treatment/Interventions  ADLs/Self Care Home Management;Gait training;Stair training;Functional mobility training;Therapeutic activities;Aquatic Therapy;Cryotherapy;Electrical Stimulation;Iontophoresis 4mg /ml Dexamethasone;Moist Heat;Therapeutic exercise;Balance training;Neuromuscular re-education;Manual techniques;Dry needling;Taping;Patient/family education    PT Next Visit Plan  self distraction R hip, dry needling, STM hip adductor and hip flexors,  hip extensor strengthening, joint mobilization and distraction    Consulted and Agree with Plan  of Care  Patient       Patient will benefit from skilled therapeutic intervention in order to improve the following deficits and impairments:     Visit Diagnosis: Pain in right hip  Other symptoms and signs involving the musculoskeletal system  Pain in left hip  Muscle  weakness (generalized)  Pain of right upper extremity     Problem List Patient Active Problem List   Diagnosis Date Noted  . Primary osteoarthritis of both hips 01/20/2017  . Chronic right shoulder pain 01/20/2017  . Cough 08/19/2014  . Pertussis exposure 08/19/2014  . 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 ALLERGIC RHINITIS 05/29/2009  . BACK PAIN, THORACIC REGION 03/15/2009  . FOOT PAIN, LEFT 01/18/2009  . IRON DEFICIENCY 11/16/2007  . DIZZINESS 11/16/2007    Everest Brod Nilda Simmer PT, MPH  03/15/2019, 9:35 AM  [facility] [place], [place], [address] Phone: [phone]   Fax:  [phone]  Name: Julia Griffith MRN: [id number] Date of Birth: 08-Jun-1970

## 2019-03-18 ENCOUNTER — Ambulatory Visit (INDEPENDENT_AMBULATORY_CARE_PROVIDER_SITE_OTHER): Payer: Managed Care, Other (non HMO) | Admitting: Physical Therapy

## 2019-03-18 ENCOUNTER — Other Ambulatory Visit: Payer: Self-pay

## 2019-03-18 ENCOUNTER — Encounter: Payer: Self-pay | Admitting: Physical Therapy

## 2019-03-18 DIAGNOSIS — R29898 Other symptoms and signs involving the musculoskeletal system: Secondary | ICD-10-CM | POA: Diagnosis not present

## 2019-03-18 DIAGNOSIS — M25551 Pain in right hip: Secondary | ICD-10-CM | POA: Diagnosis not present

## 2019-03-18 NOTE — Therapy (Signed)
[place], [place], [address] Phone: [phone]   Fax:  [phone]  Physical Therapy Treatment  Patient Details  Name: Julia Griffith MRN: [id number] Date of Birth: April 13, 1970 Referring Provider (PT): Silverio Decamp, MD   Encounter Date: 03/18/2019  PT End of Session - 03/18/19 1017    Visit Number  6    Number of Visits  12    Date for PT Re-Evaluation  04/12/19    PT Start Time  1016    PT Stop Time  1105    PT Time Calculation (min)  49 min (MHP last 10 min )    Activity Tolerance  Patient tolerated treatment well       Past Medical History:  Diagnosis Date  . Osteoarthritis of right hip 10/19/2012  . Postpartum depression     Past Surgical History:  Procedure Laterality Date  . LEEP    . Right TM repair    . thumb and middle finger surgery     overuse injury    There were no vitals filed for this visit.  Subjective Assessment - 03/18/19 1018    Subjective  Pt reports it is a good day, relatively low pain.  Continues to have pain and difficulty lifting leg up to put Rt shoe on.  She did pilates and stretches this morning prior to arrival.     Patient Stated Goals  Reduce pain and discomfort; release the muscles    Currently in Pain?  Yes    Pain Score  3     Pain Location  Hip    Pain Orientation  Right    Pain Descriptors / Indicators  Aching    Aggravating Factors   getting shoe on, walking up stairs    Pain Relieving Factors  medication, stretches, DN         [facility] PT Assessment - 03/18/19 0001      Assessment   Medical Diagnosis  Primary OA of both hips    Referring Provider (PT)  Silverio Decamp, MD    Onset Date/Surgical Date  --   initial onset 8 years ago;  referral 02/24/19   Hand Dominance  Right    Next MD Visit  04/08/19    Prior Therapy  known to our clinic from prior PT      [facility] Adult PT Treatment/Exercise - 03/18/19 0001      Knee/Hip Exercises: Stretches   Hip Flexor Stretch  Right;3 reps;30 seconds   thomas position    Other Knee/Hip Stretches  trial of supine butterfly stretch; not tolerated well.   Verbally reviewed current HEP.      Moist Heat Therapy   Number Minutes Moist Heat  10 Minutes    Moist Heat Location  Lumbar Spine   Rt thigh     Manual Therapy   Manual Therapy  Soft tissue mobilization;Myofascial release    Joint Mobilization  Rt AP mob attempted, not tolerated.     Soft tissue mobilization  Deep tissue work through [employer] med, min, hip rotators including piriformis (with and without active ER or abdct of LE).  TPR to Rt adductor muscles, Rt glute med.      Myofascial Release  Rt hip adductors, Rt glute med, Rt TFL and lateral quad     Manual Traction  Rt long leg distraction                  PT Long  Term Goals - 03/01/19 1358      PT LONG TERM GOAL #1   Title  The patient will be independent with HEP for bilateral hip stretching, strengthening, and self mobilization.    Time  6    Period  Weeks    Target Date  04/12/19      PT LONG TERM GOAL #2   Title  The patient will report pain in R hip at rest < or equal to 3/10.    Baseline  6/10 @ baseline.    Time  6    Period  Weeks    Target Date  04/12/19      PT LONG TERM GOAL #3   Title  The patient will report functional limitation per FOTO < or equal to 25%.    Baseline  36% limited at eval    Time  6    Period  Weeks    Target Date  04/12/19      PT LONG TERM GOAL #4   Title  The patient will report no pain in the right hip when negotiating steps.    Time  6    Period  Weeks    Target Date  04/12/19      PT LONG TERM GOAL #5   Title  The patient will improve hip extensor strength to 5/5.    Baseline  4/5 bilaterally    Time  6    Period  Weeks    Target Date  04/12/19            Plan - 03/18/19 1127    Clinical Impression Statement  Pt reporting 60% improvement in hip since starting therapy.  Pt  continues to have very limited IR in Rt hip. Tight and tender Rt adductors, hip rotators, TFL, and sartorius; improved with manual therapy.  Progressing gradually towards goals.    Rehab Potential  Good    PT Frequency  2x / week    PT Duration  6 weeks    PT Treatment/Interventions  ADLs/Self Care Home Management;Gait training;Stair training;Functional mobility training;Therapeutic activities;Aquatic Therapy;Cryotherapy;Electrical Stimulation;Iontophoresis 4mg /ml Dexamethasone;Moist Heat;Therapeutic exercise;Balance training;Neuromuscular re-education;Manual techniques;Dry needling;Taping;Patient/family education    PT Next Visit Plan  dry needling, STM hip adductor and hip flexors,  hip extensor strengthening, joint mobilization and distraction.  Assess hip strength (for goal)    Consulted and Agree with Plan of Care  Patient       Patient will benefit from skilled therapeutic intervention in order to improve the following deficits and impairments:     Visit Diagnosis: Pain in right hip  Other symptoms and signs involving the musculoskeletal system     Problem List Patient Active Problem List   Diagnosis Date Noted  . Primary osteoarthritis of both hips 01/20/2017  . Chronic right shoulder pain 01/20/2017  . Cough 08/19/2014  . Pertussis exposure 08/19/2014  . 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 ALLERGIC RHINITIS 05/29/2009  . BACK PAIN, THORACIC REGION 03/15/2009  . FOOT PAIN, LEFT 01/18/2009  . IRON DEFICIENCY 11/16/2007  . DIZZINESS 11/16/2007   13/09/2007, PTA 03/18/19 1:13 PM  [facility] [place] [address] [place], [place], [place] Phone: [phone]   Fax:  [phone]  Name: Julia Griffith MRN: Larita Fife Date of Birth: [birth year]

## 2019-03-22 ENCOUNTER — Ambulatory Visit (INDEPENDENT_AMBULATORY_CARE_PROVIDER_SITE_OTHER): Payer: Managed Care, Other (non HMO) | Admitting: Rehabilitative and Restorative Service Providers"

## 2019-03-22 ENCOUNTER — Encounter: Payer: Self-pay | Admitting: Rehabilitative and Restorative Service Providers"

## 2019-03-22 ENCOUNTER — Other Ambulatory Visit: Payer: Self-pay

## 2019-03-22 DIAGNOSIS — R29898 Other symptoms and signs involving the musculoskeletal system: Secondary | ICD-10-CM

## 2019-03-22 DIAGNOSIS — M25552 Pain in left hip: Secondary | ICD-10-CM

## 2019-03-22 DIAGNOSIS — M25551 Pain in right hip: Secondary | ICD-10-CM | POA: Diagnosis not present

## 2019-03-22 DIAGNOSIS — M6281 Muscle weakness (generalized): Secondary | ICD-10-CM | POA: Diagnosis not present

## 2019-03-22 DIAGNOSIS — M79601 Pain in right arm: Secondary | ICD-10-CM
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
## 2019-03-22 NOTE — Patient Instructions (Signed)
Add walking backwards to exercise program  Treadmill - speed as tolerated  5 min progressing to 8-10 min  1x/day

## 2019-03-25 ENCOUNTER — Ambulatory Visit (INDEPENDENT_AMBULATORY_CARE_PROVIDER_SITE_OTHER): Payer: Managed Care, Other (non HMO) | Admitting: Rehabilitative and Restorative Service Providers"

## 2019-03-25 ENCOUNTER — Other Ambulatory Visit: Payer: Self-pay

## 2019-03-25 ENCOUNTER — Encounter: Payer: Self-pay | Admitting: Rehabilitative and Restorative Service Providers"

## 2019-03-25 DIAGNOSIS — M6281 Muscle weakness (generalized): Secondary | ICD-10-CM | POA: Diagnosis not present

## 2019-03-25 DIAGNOSIS — R29898 Other symptoms and signs involving the musculoskeletal system: Secondary | ICD-10-CM | POA: Diagnosis not present

## 2019-03-25 DIAGNOSIS — M25552 Pain in left hip: Secondary | ICD-10-CM

## 2019-03-25 DIAGNOSIS — M25551 Pain in right hip: Secondary | ICD-10-CM | POA: Diagnosis not present
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
## 2019-03-29 ENCOUNTER — Encounter: Payer: Self-pay | Admitting: Physical Therapy

## 2019-03-29 ENCOUNTER — Ambulatory Visit (INDEPENDENT_AMBULATORY_CARE_PROVIDER_SITE_OTHER): Payer: Managed Care, Other (non HMO) | Admitting: Physical Therapy

## 2019-03-29 ENCOUNTER — Other Ambulatory Visit: Payer: Self-pay

## 2019-03-29 DIAGNOSIS — R29898 Other symptoms and signs involving the musculoskeletal system: Secondary | ICD-10-CM

## 2019-03-29 DIAGNOSIS — M25551 Pain in right hip: Secondary | ICD-10-CM
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
## 2019-03-31 ENCOUNTER — Other Ambulatory Visit: Payer: Self-pay

## 2019-03-31 ENCOUNTER — Ambulatory Visit (INDEPENDENT_AMBULATORY_CARE_PROVIDER_SITE_OTHER): Payer: Managed Care, Other (non HMO) | Admitting: Rehabilitative and Restorative Service Providers"

## 2019-03-31 ENCOUNTER — Encounter: Payer: Self-pay | Admitting: Rehabilitative and Restorative Service Providers"

## 2019-03-31 DIAGNOSIS — M25552 Pain in left hip: Secondary | ICD-10-CM

## 2019-03-31 DIAGNOSIS — M25551 Pain in right hip: Secondary | ICD-10-CM

## 2019-03-31 DIAGNOSIS — R29898 Other symptoms and signs involving the musculoskeletal system: Secondary | ICD-10-CM

## 2019-03-31 DIAGNOSIS — M6281 Muscle weakness (generalized): Secondary | ICD-10-CM | POA: Diagnosis not present
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
## 2019-04-07 ENCOUNTER — Ambulatory Visit (INDEPENDENT_AMBULATORY_CARE_PROVIDER_SITE_OTHER): Payer: Managed Care, Other (non HMO) | Admitting: Rehabilitative and Restorative Service Providers"

## 2019-04-07 ENCOUNTER — Other Ambulatory Visit: Payer: Self-pay

## 2019-04-07 ENCOUNTER — Encounter: Payer: Self-pay | Admitting: Rehabilitative and Restorative Service Providers"

## 2019-04-07 DIAGNOSIS — M6281 Muscle weakness (generalized): Secondary | ICD-10-CM | POA: Diagnosis not present

## 2019-04-07 DIAGNOSIS — M25551 Pain in right hip: Secondary | ICD-10-CM | POA: Diagnosis not present

## 2019-04-07 DIAGNOSIS — M25552 Pain in left hip: Secondary | ICD-10-CM

## 2019-04-07 DIAGNOSIS — R29898 Other symptoms and signs involving the musculoskeletal system: Secondary | ICD-10-CM

## 2019-04-07 NOTE — Therapy (Signed)
[facility] [place] [address] [place], [place], [address] Phone: [phone]   Fax:  [phone]  Physical Therapy Treatment  Patient Details  Name: Julia Griffith MRN: [id number] Date of Birth: 1970-02-25 Referring Provider (PT): Monica Becton, MD   Encounter Date: 04/07/2019  PT End of Session - 04/07/19 0759    Visit Number  11    Number of Visits  18    Date for PT Re-Evaluation  05/31/19    PT Start Time  0757    PT Stop Time  0852   moist heat end of treatment   PT Time Calculation (min)  55 min    Activity Tolerance  Patient tolerated treatment well;No increased pain       Past Medical History:  Diagnosis Date  . Osteoarthritis of right hip 10/19/2012  . Postpartum depression     Past Surgical History:  Procedure Laterality Date  . LEEP    . Right TM repair    . thumb and middle finger surgery     overuse injury    There were no vitals filed for this visit.  Subjective Assessment - 04/07/19 0807    Subjective  Continues to improve gradually - notices a lot of tightness through the front of the Rt > Lt hip    Currently in Pain?  Yes    Pain Score  4     Pain Location  Hip    Pain Orientation  Right    Pain Descriptors / Indicators  Tightness    Pain Type  Chronic pain    Pain Onset  More than a month ago    Pain Frequency  Intermittent         [facility] PT Assessment - 04/07/19 0001      Assessment   Medical Diagnosis  Primary OA of both hips    Referring Provider (PT)  Monica Becton, MD    Onset Date/Surgical Date  --   initial onset 8 years ago;  referral 02/24/19   Hand Dominance  Right    Next MD Visit  04/08/19    Prior Therapy  known to our clinic from prior PT      AROM   Lumbar Flexion  80%     Lumbar Extension  55%    Lumbar - Right Side Bend  WNLs    Lumbar - Left Side Bend  WNLs    Lumbar - Right Rotation  WNLs    Lumbar - Left Rotation  WNLs      PROM   Right  Hip Extension  --   tight in hip extension noted through the hip flexors      Strength   Right Hip Flexion  5/5    Right Hip Extension  4+/5    Right Hip ABduction  5/5    Left Hip Flexion  5/5    Left Hip Extension  --   5-/5     Flexibility   Hamstrings  tightness Lt > Rt       Palpation   Palpation comment  tightness noted in the Rt lateral and posteriorlateral hip to knee; hamstrings; hip adductors; hip flexors                    [facility] Adult PT Treatment/Exercise - 04/07/19 0001      Knee/Hip Exercises: Stretches   Passive Hamstring Stretch  Right;2 reps;30 seconds   supine with strap  Hip Flexor Stretch  Right;2 reps;20 seconds   thomas position supine    Hip Flexor Stretch Limitations  supine Thomas position     ITB Stretch  Right;2 reps;20 seconds    Piriformis Stretch  Right;2 reps;20 seconds    Other Knee/Hip Stretches  Rt supine adductor stretch x 2 reps of 20 sec       Knee/Hip Exercises: Standing   Hip Extension  AROM;Stengthening;Right;Left;10 reps;Knee straight      Knee/Hip Exercises: Supine   Bridges  Strengthening;Both;10 reps      Knee/Hip Exercises: Prone   Hip Extension  AROM;Strengthening;Right;Left;10 reps    Other Prone Exercises  Quadriped hip extension x 5 reps R and L.    Other Prone Exercises  prone prop on forearms to stretch hip flexors       Moist Heat Therapy   Number Minutes Moist Heat  10 Minutes    Moist Heat Location  Hip   right anterior hip and thigh      Manual Therapy   Manual Therapy  Soft tissue mobilization;Myofascial release   pt supine and prone    Manual therapy comments  skilled palpation and monitoring during DN    Joint Mobilization  Rt hip PA mobs     Soft tissue mobilization  deep tissue work through the posterior lateral hip and thigh     Myofascial Release  Rt posterior hip; Rt adductors        Trigger Point Dry Needling - 04/07/19 0001    Consent Given?  Yes    Education Handout Provided   Previously provided    Dry Needling Comments  pt supine   Rt LE only     Electrical Stimulation Performed with Dry Needling  Yes    Quadriceps Response  Palpable increased muscle length    Adductor Response  Palpable increased muscle length    Rectus femoris Response  Palpable increased muscle length                PT Long Term Goals - 04/07/19 0840      PT LONG TERM GOAL #1   Title  The patient will be independent with HEP for bilateral hip stretching, strengthening, and self mobilization.    Time  12    Period  Weeks    Status  Revised    Target Date  05/31/19      PT LONG TERM GOAL #2   Title  The patient will report pain in R hip at rest < or equal to 3/10.    Baseline  2/10    Time  12    Period  Weeks    Status  Revised    Target Date  05/31/19      PT LONG TERM GOAL #3   Title  The patient will report functional limitation per FOTO < or equal to 25%.    Baseline  36% limited at eval    Time  12    Period  Weeks    Target Date  05/31/19      PT LONG TERM GOAL #4   Title  The patient will report no pain in the right hip when negotiating steps.    Time  12    Period  Weeks    Status  Revised    Target Date  05/31/19      PT LONG TERM GOAL #5   Title  The patient will improve hip extensor strength to 5/5.  Baseline  4+/5 to 5/5 currently    Time  12    Period  Weeks    Status  Revised    Target Date  05/31/19            Plan - 04/07/19 0827    Clinical Impression Statement  Patient reports some improvement in hip pain however continues to have tightness and pulling discomfort through the hip flexors Rt > Lt. Patient continues to work out in the pool and on land and is stretching daily. She demonstrates improved hip extensor strength and increasing tissue extensibility through the hip flexors and adductors. She will benefit form continued treatment to achieve goals of treatment.    Rehab Potential  Good    PT Frequency  2x / week    PT Duration   6 weeks    PT Treatment/Interventions  ADLs/Self Care Home Management;Gait training;Stair training;Functional mobility training;Therapeutic activities;Aquatic Therapy;Cryotherapy;Electrical Stimulation;Iontophoresis 4mg /ml Dexamethasone;Moist Heat;Therapeutic exercise;Balance training;Neuromuscular re-education;Manual techniques;Dry needling;Taping;Patient/family education    PT Next Visit Plan  dry needling, STM hip adductor and hip flexors,  hip extensor strengthening, joint mobilization and distraction.    Consulted and Agree with Plan of Care  Patient       Patient will benefit from skilled therapeutic intervention in order to improve the following deficits and impairments:     Visit Diagnosis: Pain in right hip - Plan: PT plan of care cert/re-cert  Other symptoms and signs involving the musculoskeletal system - Plan: PT plan of care cert/re-cert  Pain in left hip - Plan: PT plan of care cert/re-cert  Muscle weakness (generalized) - Plan: PT plan of care cert/re-cert     Problem List Patient Active Problem List   Diagnosis Date Noted  . Primary osteoarthritis of both hips 01/20/2017  . Chronic right shoulder pain 01/20/2017  . Cough 08/19/2014  . Pertussis exposure 08/19/2014  . 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 ALLERGIC RHINITIS 05/29/2009  . BACK PAIN, THORACIC REGION 03/15/2009  . FOOT PAIN, LEFT 01/18/2009  . IRON DEFICIENCY 11/16/2007  . DIZZINESS 11/16/2007    Lindsay Soulliere 13/09/2007 PT, MPH  04/07/2019, 8:48 AM  [facility] [address] [place], [place], [place] Phone: [phone]   Fax:  [phone]  Name: Julia Griffith MRN: Larita Fife Date of Birth: [birth year]

## 2019-04-08 ENCOUNTER — Ambulatory Visit (INDEPENDENT_AMBULATORY_CARE_PROVIDER_SITE_OTHER): Payer: Managed Care, Other (non HMO) | Admitting: Sports Medicine

## 2019-04-08 DIAGNOSIS — M160 Bilateral primary osteoarthritis of hip: Secondary | ICD-10-CM | POA: Diagnosis not present

## 2019-04-08 NOTE — Assessment & Plan Note (Signed)
This pleasant 49 year old female returns, she has bilateral hip osteoarthritis, right significantly worse than left. She has been evaluated by Dr. Caswell Corwin at the hip center, she had an arthrogram in 2013 that showed the osteoarthritis as well as evidence of femoral acetabular impingement. Her experience with the arthrogram was very painful and so she is somewhat hesitant to proceed with an injection. We started with a nonpharmacologic approach with Celebrex, physical therapy, she improves on the day of therapy but symptoms always come back. Today we are proceeding with a hip joint injection with ultrasound guidance, return to see me 1 month after the injection to evaluate relief. She understands that total hip arthroplasty is the only long-term cure for this.

## 2019-04-08 NOTE — Progress Notes (Signed)
    Procedures performed today:    Procedure: Real-time Ultrasound Guided injection of the right hip joint Device: Samsung HS60  Verbal informed consent obtained.  Time-out conducted.  Noted no overlying erythema, induration, or other signs of local infection.  Skin prepped in a sterile fashion.  Local anesthesia: Topical Ethyl chloride.  With sterile technique and under real time ultrasound guidance: 1 cc Kenalog 40, 2 cc lidocaine, 2 cc bupivacaine injected easily Completed without difficulty  Pain immediately resolved suggesting accurate placement of the medication.  Advised to call if fevers/chills, erythema, induration, drainage, or persistent bleeding.  Images permanently stored and available for review in the ultrasound unit.  Impression: Technically successful ultrasound guided injection.  Independent interpretation of notes and tests performed by another provider:   None.  Brief History, Exam, Impression, and Recommendations:    Primary osteoarthritis of both hips This pleasant 49 year old female returns, she has bilateral hip osteoarthritis, right significantly worse than left. She has been evaluated by Dr. Caswell Corwin at the hip center, she had an arthrogram in 2013 that showed the osteoarthritis as well as evidence of femoral acetabular impingement. Her experience with the arthrogram was very painful and so she is somewhat hesitant to proceed with an injection. We started with a nonpharmacologic approach with Celebrex, physical therapy, she improves on the day of therapy but symptoms always come back. Today we are proceeding with a hip joint injection with ultrasound guidance, return to see me 1 month after the injection to evaluate relief. She understands that total hip arthroplasty is the only long-term cure for this.    ___________________________________________ Julia Griffith. Benjamin Stain, M.D., ABFM., CAQSM. Primary Care and Sports Medicine [facility]  [facility]  Adjunct Instructor of Family Medicine  [employer]

## 2019-04-16 ENCOUNTER — Encounter: Payer: Managed Care, Other (non HMO) | Admitting: Rehabilitative and Restorative Service Providers"

## 2019-04-21 ENCOUNTER — Ambulatory Visit (INDEPENDENT_AMBULATORY_CARE_PROVIDER_SITE_OTHER): Payer: Managed Care, Other (non HMO) | Admitting: Rehabilitative and Restorative Service Providers"

## 2019-04-21 ENCOUNTER — Other Ambulatory Visit: Payer: Self-pay

## 2019-04-21 DIAGNOSIS — R29898 Other symptoms and signs involving the musculoskeletal system: Secondary | ICD-10-CM

## 2019-04-21 DIAGNOSIS — M6281 Muscle weakness (generalized): Secondary | ICD-10-CM

## 2019-04-21 DIAGNOSIS — M25552 Pain in left hip: Secondary | ICD-10-CM

## 2019-04-21 DIAGNOSIS — M25551 Pain in right hip: Secondary | ICD-10-CM | POA: Diagnosis not present

## 2019-04-21 NOTE — Therapy (Signed)
[facility] [place] [address] [place], [place], [address] Phone: [phone]   Fax:  [phone]  Physical Therapy Treatment  Patient Details  Name: Julia Griffith MRN: [id number] Date of Birth: [birth year] Referring Provider (PT): Monica Becton, MD   Encounter Date: 04/21/2019  PT End of Session - 04/21/19 0803    Visit Number  12    Number of Visits  18    Date for PT Re-Evaluation  05/31/19    PT Start Time  0803    PT Stop Time  0854   MH end of treatment   PT Time Calculation (min)  51 min    Activity Tolerance  Patient tolerated treatment well       Past Medical History:  Diagnosis Date  . Osteoarthritis of right hip 10/19/2012  . Postpartum depression     Past Surgical History:  Procedure Laterality Date  . LEEP    . Right TM repair    . thumb and middle finger surgery     overuse injury    There were no vitals filed for this visit.  Subjective Assessment - 04/21/19 0804    Subjective  Improved since she had the injection. Less tightness - going deeper with stretches. Working out every day and stretching afterward.    Currently in Pain?  No/denies    Pain Score  0-No pain         [facility] PT Assessment - 04/21/19 0001      Assessment   Medical Diagnosis  Primary OA of both hips    Referring Provider (PT)  Monica Becton, MD    Onset Date/Surgical Date  --   initial onset 8 years ago;  referral 02/24/19   Hand Dominance  Right    Next MD Visit  04/08/19    Prior Therapy  known to our clinic from prior PT      PROM   Right Hip Extension  --   mild tightness hip extension noted through the hip flexors    Right Hip External Rotation   --   tight end range   Right Hip Internal Rotation   --   significant tightness     Strength   Right Hip Flexion  5/5    Right Hip Extension  --   5-/5   Right Hip ABduction  5/5    Left Hip Flexion  5/5    Left Hip Extension  --   5-/5     Flexibility   Hamstrings  minimal tightness       Palpation   Palpation comment  tightness noted in the Rt lateral and posteriorlateral hip to knee; hamstrings; hip adductors; hip flexors                    [facility] Adult PT Treatment/Exercise - 04/21/19 0001      Knee/Hip Exercises: Stretches   Passive Hamstring Stretch  Right;2 reps;30 seconds   supine with strap      Knee/Hip Exercises: Aerobic   Nustep  L6x5'      Knee/Hip Exercises: Prone   Hip Extension  AROM;Strengthening;Right;Left;10 reps    Other Prone Exercises  Quadriped hip extension x 5 reps R and L.   glut sets 10 sec x 10    Other Prone Exercises  prone prop on forearms to stretch hip flexors       Moist Heat Therapy   Number Minutes Moist Heat  10 Minutes  Moist Heat Location  Hip   right anterior hip and thigh      Manual Therapy   Manual Therapy  Soft tissue mobilization;Myofascial release   pt supine and prone    Manual therapy comments  skilled palpation and monitoring during DN    Joint Mobilization  Rt hip PA mobs     Soft tissue mobilization  deep tissue work through the posterior lateral hip and thigh     Myofascial Release  Rt posterior hip; Rt adductors     Passive ROM  hip rotation pt prone knee flexed to 90 deg        Trigger Point Dry Needling - 04/21/19 0001    Consent Given?  Yes    Education Handout Provided  Previously provided    Dry Needling Comments  pt prone Rt side only     Electrical Stimulation Performed with Dry Needling  Yes    Gluteus Medius Response  Palpable increased muscle length    Gluteus Maximus Response  Palpable increased muscle length    Piriformis Response  Palpable increased muscle length                PT Long Term Goals - 04/07/19 0840      PT LONG TERM GOAL #1   Title  The patient will be independent with HEP for bilateral hip stretching, strengthening, and self mobilization.    Time  12    Period  Weeks    Status  Revised    Target  Date  05/31/19      PT LONG TERM GOAL #2   Title  The patient will report pain in R hip at rest < or equal to 3/10.    Baseline  2/10    Time  12    Period  Weeks    Status  Revised    Target Date  05/31/19      PT LONG TERM GOAL #3   Title  The patient will report functional limitation per FOTO < or equal to 25%.    Baseline  36% limited at eval    Time  12    Period  Weeks    Target Date  05/31/19      PT LONG TERM GOAL #4   Title  The patient will report no pain in the right hip when negotiating steps.    Time  12    Period  Weeks    Status  Revised    Target Date  05/31/19      PT LONG TERM GOAL #5   Title  The patient will improve hip extensor strength to 5/5.    Baseline  4+/5 to 5/5 currently    Time  12    Period  Weeks    Status  Revised    Target Date  05/31/19            Plan - 04/21/19 0805    Clinical Impression Statement  Good improvement forllowing injection Rt hip. Patient is increasing exercise and activity level at home. There is less palpable tightness through musculature of Rt hip and thigh; improving mobility and ROM noted. Patient demonstrated increased hip rotation on Rt following DN and manual work today.    Rehab Potential  Good    PT Frequency  2x / week    PT Duration  6 weeks    PT Treatment/Interventions  ADLs/Self Care Home Management;Gait training;Stair training;Functional mobility training;Therapeutic activities;Aquatic Therapy;Cryotherapy;Electrical Stimulation;Iontophoresis 4mg /ml Dexamethasone;Moist  Heat;Therapeutic exercise;Balance training;Neuromuscular re-education;Manual techniques;Dry needling;Taping;Patient/family education    PT Next Visit Plan  dry needling, STM hip adductor and hip flexors,  hip extensor strengthening, joint mobilization and distraction.    PT Home Exercise Plan  HEP    Consulted and Agree with Plan of Care  Patient       Patient will benefit from skilled therapeutic intervention in order to improve the  following deficits and impairments:     Visit Diagnosis: Pain in right hip  Other symptoms and signs involving the musculoskeletal system  Pain in left hip  Muscle weakness (generalized)     Problem List Patient Active Problem List   Diagnosis Date Noted  . Primary osteoarthritis of both hips 01/20/2017  . Chronic right shoulder pain 01/20/2017  . Cough 08/19/2014  . Pertussis exposure 08/19/2014  . 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 ALLERGIC RHINITIS 05/29/2009  . BACK PAIN, THORACIC REGION 03/15/2009  . FOOT PAIN, LEFT 01/18/2009  . IRON DEFICIENCY 11/16/2007  . DIZZINESS 11/16/2007    Koa Palla Nilda Simmer PT, MPH  04/21/2019, 8:42 AM  [facility] [place], [place], [address] Phone: [phone]   Fax:  [phone]  Name: Jeslie Lowe MRN: [id number] Date of Birth: [birth year]

## 2019-04-28 ENCOUNTER — Ambulatory Visit (INDEPENDENT_AMBULATORY_CARE_PROVIDER_SITE_OTHER): Payer: No Typology Code available for payment source | Admitting: Rehabilitative and Restorative Service Providers"

## 2019-04-28 ENCOUNTER — Encounter: Payer: Self-pay | Admitting: Rehabilitative and Restorative Service Providers"

## 2019-04-28 ENCOUNTER — Other Ambulatory Visit: Payer: Self-pay

## 2019-04-28 DIAGNOSIS — M25551 Pain in right hip: Secondary | ICD-10-CM

## 2019-04-28 DIAGNOSIS — M25552 Pain in left hip: Secondary | ICD-10-CM | POA: Diagnosis not present

## 2019-04-28 DIAGNOSIS — R29898 Other symptoms and signs involving the musculoskeletal system: Secondary | ICD-10-CM | POA: Diagnosis not present

## 2019-04-28 DIAGNOSIS — M6281 Muscle weakness (generalized): Secondary | ICD-10-CM

## 2019-04-28 NOTE — Therapy (Signed)
[place], [place], [address] Phone: [phone]   Fax:  [phone]  Physical Therapy Treatment  Patient Details  Name: Julia Griffith MRN: [id number] Date of Birth: August 09, 1970 Referring Provider (PT): Silverio Decamp, MD   Encounter Date: 04/28/2019  PT End of Session - 04/28/19 0803    Visit Number  13    Number of Visits  18    Date for PT Re-Evaluation  05/31/19    PT Start Time  0800    PT Stop Time  0850    PT Time Calculation (min)  50 min    Activity Tolerance  Patient tolerated treatment well       Past Medical History:  Diagnosis Date  . Osteoarthritis of right hip 10/19/2012  . Postpartum depression     Past Surgical History:  Procedure Laterality Date  . LEEP    . Right TM repair    . thumb and middle finger surgery     overuse injury    There were no vitals filed for this visit.  Subjective Assessment - 04/28/19 0803    Subjective  patient reports continued improvement in hip pain following injection. She is working on her exercises at home and    Currently in Pain?  No/denies         [facility] PT Assessment - 04/28/19 0001      Assessment   Medical Diagnosis  Primary OA of both hips    Referring Provider (PT)  Silverio Decamp, MD    Onset Date/Surgical Date  --   initial onset 8 years ago;  referral 02/24/19   Hand Dominance  Right    Next MD Visit  04/08/19    Prior Therapy  known to our clinic from prior PT      Flexibility   Hamstrings  minimal tightness     Quadriceps  minimal tightness Rt > Lt       Palpation   Palpation comment  tightness noted in the Rt medial thigh/adductors and quads                    [facility] Adult PT Treatment/Exercise - 04/28/19 0001      Knee/Hip Exercises: Stretches   Passive Hamstring Stretch  Right;2 reps;30 seconds   supine with strap    ITB Stretch  Right;2 reps;20 seconds    Piriformis Stretch   Right;2 reps;20 seconds    Other Knee/Hip Stretches  Rt supine adductor stretch x 2 reps of 20 sec       Moist Heat Therapy   Number Minutes Moist Heat  10 Minutes    Moist Heat Location  Hip   right anterior hip and medial thigh      Manual Therapy   Manual Therapy  Soft tissue mobilization;Myofascial release   pt supine and prone    Manual therapy comments  skilled palpation and monitoring during DN    Soft tissue mobilization  deep tissue work through the medial hip and thigh through adductors and quads     Myofascial Release  Rt quads; Rt adductors     Passive ROM  hip rotation pt supine knee flexed to 90 deg        Trigger Point Dry Needling - 04/28/19 0001    Consent Given?  Yes    Education Handout Provided  Previously provided    Dry Needling Comments  pt supine Rt LE only  Electrical Stimulation Performed with Dry Needling  Yes    Quadriceps Response  Palpable increased muscle length    Adductor Response  Palpable increased muscle length    Rectus femoris Response  Palpable increased muscle length                PT Long Term Goals - 04/07/19 0840      PT LONG TERM GOAL #1   Title  The patient will be independent with HEP for bilateral hip stretching, strengthening, and self mobilization.    Time  12    Period  Weeks    Status  Revised    Target Date  05/31/19      PT LONG TERM GOAL #2   Title  The patient will report pain in R hip at rest < or equal to 3/10.    Baseline  2/10    Time  12    Period  Weeks    Status  Revised    Target Date  05/31/19      PT LONG TERM GOAL #3   Title  The patient will report functional limitation per FOTO < or equal to 25%.    Baseline  36% limited at eval    Time  12    Period  Weeks    Target Date  05/31/19      PT LONG TERM GOAL #4   Title  The patient will report no pain in the right hip when negotiating steps.    Time  12    Period  Weeks    Status  Revised    Target Date  05/31/19      PT LONG TERM  GOAL #5   Title  The patient will improve hip extensor strength to 5/5.    Baseline  4+/5 to 5/5 currently    Time  12    Period  Weeks    Status  Revised    Target Date  05/31/19            Plan - 04/28/19 0819    Clinical Impression Statement  Continued improvement with decreased pain and increased mobility/ROM and functional abilities. Patient will continue with HEP and schedule for return appt in two weeks    Rehab Potential  Good    PT Frequency  2x / week    PT Duration  6 weeks    PT Treatment/Interventions  ADLs/Self Care Home Management;Gait training;Stair training;Functional mobility training;Therapeutic activities;Aquatic Therapy;Cryotherapy;Electrical Stimulation;Iontophoresis 4mg /ml Dexamethasone;Moist Heat;Therapeutic exercise;Balance training;Neuromuscular re-education;Manual techniques;Dry needling;Taping;Patient/family education    PT Next Visit Plan  dry needling, STM hip adductor and hip flexors,  hip extensor strengthening, joint mobilization and distraction.    PT Home Exercise Plan  HEP    Consulted and Agree with Plan of Care  Patient       Patient will benefit from skilled therapeutic intervention in order to improve the following deficits and impairments:     Visit Diagnosis: Pain in right hip  Other symptoms and signs involving the musculoskeletal system  Pain in left hip  Muscle weakness (generalized)     Problem List Patient Active Problem List   Diagnosis Date Noted  . Primary osteoarthritis of both hips 01/20/2017  . Chronic right shoulder pain 01/20/2017  . Cough 08/19/2014  . Pertussis exposure 08/19/2014  . 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 ALLERGIC RHINITIS 05/29/2009  . BACK PAIN, THORACIC REGION 03/15/2009  . FOOT PAIN, LEFT 01/18/2009  . IRON DEFICIENCY 11/16/2007  .  DIZZINESS 11/16/2007    Rya Rausch P Amberlee Garvey PT.MPH  04/28/2019, 8:41 AM  [facility] [address] [place] [address]  [address] [place], [place], [address] Phone: [phone]   Fax:  [phone]  Name: Julia Griffith MRN: [id number] Date of Birth: 13-Oct-1970

## 2019-05-06 ENCOUNTER — Ambulatory Visit: Payer: Managed Care, Other (non HMO) | Admitting: Sports Medicine

## 2019-05-10 ENCOUNTER — Ambulatory Visit (INDEPENDENT_AMBULATORY_CARE_PROVIDER_SITE_OTHER): Payer: No Typology Code available for payment source | Admitting: Rehabilitative and Restorative Service Providers"

## 2019-05-10 ENCOUNTER — Encounter: Payer: Self-pay | Admitting: Rehabilitative and Restorative Service Providers"

## 2019-05-10 ENCOUNTER — Other Ambulatory Visit: Payer: Self-pay

## 2019-05-10 DIAGNOSIS — R29898 Other symptoms and signs involving the musculoskeletal system: Secondary | ICD-10-CM | POA: Diagnosis not present

## 2019-05-10 DIAGNOSIS — M25551 Pain in right hip: Secondary | ICD-10-CM

## 2019-05-10 DIAGNOSIS — M6281 Muscle weakness (generalized): Secondary | ICD-10-CM | POA: Diagnosis not present

## 2019-05-10 NOTE — Therapy (Signed)
[facility] [place] [address] [place] [address] [place], [place], [address] Phone: [phone]   Fax:  [phone]  Physical Therapy Treatment  Patient Details  Name: Julia Griffith MRN: [id number] Date of Birth: [birth year] Referring Provider (PT): Monica Becton, MD   Encounter Date: 05/10/2019  PT End of Session - 05/10/19 0811    Visit Number  14    Number of Visits  18    Date for PT Re-Evaluation  05/31/19    PT Start Time  0800    PT Stop Time  0849   Moist heat   PT Time Calculation (min)  49 min    Activity Tolerance  Patient tolerated treatment well       Past Medical History:  Diagnosis Date  . Osteoarthritis of right hip 10/19/2012  . Postpartum depression     Past Surgical History:  Procedure Laterality Date  . LEEP    . Right TM repair    . thumb and middle finger surgery     overuse injury    There were no vitals filed for this visit.  Subjective Assessment - 05/10/19 0811    Subjective  continues to do well - exercising consistently at home. Has some tightness in the Rt quads and hip flexors. Responds well to DN and manual work.    Currently in Pain?  Yes    Pain Score  1     Pain Location  Hip    Pain Orientation  Right    Pain Descriptors / Indicators  Tightness    Pain Type  Chronic pain         [facility] PT Assessment - 05/10/19 0001      Assessment   Medical Diagnosis  Primary OA of both hips    Referring Provider (PT)  Monica Becton, MD    Onset Date/Surgical Date  --   initial onset 8 years ago;  referral 02/24/19   Hand Dominance  Right    Next MD Visit  PRN    Prior Therapy  known to our clinic from prior PT      PROM   Right Hip Extension  --   tight hip extension - improving    Right Hip Flexion  110    Right Hip External Rotation   35   hip flexed to 90 deg    Right Hip Internal Rotation   20   with hip flexed to 90 deg      Flexibility   Quadriceps  minimal tightness  Rt > Lt       Palpation   Palpation comment  tightness noted in the Rt flexors and medial thigh/adductors and quads                    [facility] Adult PT Treatment/Exercise - 05/10/19 0001      Knee/Hip Exercises: Stretches   Passive Hamstring Stretch  Right;2 reps;30 seconds   supine with strap    Hip Flexor Stretch  Right;2 reps;20 seconds    Hip Flexor Stretch Limitations  supine Thomas position     ITB Stretch  Right;2 reps;20 seconds    Piriformis Stretch  Right;2 reps;20 seconds    Other Knee/Hip Stretches  Rt supine adductor stretch x 2 reps of 20 sec       Moist Heat Therapy   Number Minutes Moist Heat  10 Minutes    Moist Heat Location  Hip   right anterior hip and  medial thigh      Manual Therapy   Manual Therapy  Soft tissue mobilization;Myofascial release   pt supine and prone    Manual therapy comments  skilled palpation and monitoring during DN    Soft tissue mobilization  deep tissue work through the medial hip and thigh through adductors and quads     Myofascial Release  Rt quads; Rt adductors     Passive ROM  hip rotation pt supine knee flexed to 90 deg        Trigger Point Dry Needling - 05/10/19 0001    Consent Given?  Yes    Education Handout Provided  Previously provided    Dry Needling Comments  pt supine Rt LE only     Electrical Stimulation Performed with Dry Needling  Yes    Quadriceps Response  Palpable increased muscle length    Adductor Response  Palpable increased muscle length    Rectus femoris Response  Palpable increased muscle length                PT Long Term Goals - 04/07/19 0840      PT LONG TERM GOAL #1   Title  The patient will be independent with HEP for bilateral hip stretching, strengthening, and self mobilization.    Time  12    Period  Weeks    Status  Revised    Target Date  05/31/19      PT LONG TERM GOAL #2   Title  The patient will report pain in R hip at rest < or equal to 3/10.    Baseline  2/10     Time  12    Period  Weeks    Status  Revised    Target Date  05/31/19      PT LONG TERM GOAL #3   Title  The patient will report functional limitation per FOTO < or equal to 25%.    Baseline  36% limited at eval    Time  12    Period  Weeks    Target Date  05/31/19      PT LONG TERM GOAL #4   Title  The patient will report no pain in the right hip when negotiating steps.    Time  12    Period  Weeks    Status  Revised    Target Date  05/31/19      PT LONG TERM GOAL #5   Title  The patient will improve hip extensor strength to 5/5.    Baseline  4+/5 to 5/5 currently    Time  12    Period  Weeks    Status  Revised    Target Date  05/31/19            Plan - 05/10/19 0843    Clinical Impression Statement  Improving functional activity level. ROM and mobility are increasing through the Rt hip/LE. Patient has cointinued palpable tightness Rt hip flexors/quads/adductors    Rehab Potential  Good    PT Frequency  1x / week    PT Duration  6 weeks    PT Treatment/Interventions  ADLs/Self Care Home Management;Gait training;Stair training;Functional mobility training;Therapeutic activities;Aquatic Therapy;Cryotherapy;Electrical Stimulation;Iontophoresis 4mg /ml Dexamethasone;Moist Heat;Therapeutic exercise;Balance training;Neuromuscular re-education;Manual techniques;Dry needling;Taping;Patient/family education    PT Next Visit Plan  dry needling, STM hip adductor and hip flexors,  hip extensor strengthening, joint mobilization and distraction - anticipate d/c to independent HEP at next visit    PT  Home Exercise Plan  HEP    Consulted and Agree with Plan of Care  Patient       Patient will benefit from skilled therapeutic intervention in order to improve the following deficits and impairments:     Visit Diagnosis: Pain in right hip  Other symptoms and signs involving the musculoskeletal system  Muscle weakness (generalized)     Problem List Patient Active Problem List    Diagnosis Date Noted  . Primary osteoarthritis of both hips 01/20/2017  . Chronic right shoulder pain 01/20/2017  . Cough 08/19/2014  . Pertussis exposure 08/19/2014  . 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 ALLERGIC RHINITIS 05/29/2009  . BACK PAIN, THORACIC REGION 03/15/2009  . FOOT PAIN, LEFT 01/18/2009  . IRON DEFICIENCY 11/16/2007  . DIZZINESS 11/16/2007    Dom Haverland Nilda Simmer PT, MPH  05/10/2019, 8:45 AM  [facility] [place], [place], [address] Phone: [phone]   Fax:  [phone]  Name: Julia Griffith MRN: [id number] Date of Birth: 12/16/1970

## 2019-05-28 ENCOUNTER — Other Ambulatory Visit: Payer: Self-pay

## 2019-05-28 ENCOUNTER — Ambulatory Visit (INDEPENDENT_AMBULATORY_CARE_PROVIDER_SITE_OTHER): Payer: No Typology Code available for payment source | Admitting: Rehabilitative and Restorative Service Providers"

## 2019-05-28 ENCOUNTER — Encounter: Payer: Self-pay | Admitting: Rehabilitative and Restorative Service Providers"

## 2019-05-28 DIAGNOSIS — M6281 Muscle weakness (generalized): Secondary | ICD-10-CM | POA: Diagnosis not present

## 2019-05-28 DIAGNOSIS — R29898 Other symptoms and signs involving the musculoskeletal system: Secondary | ICD-10-CM

## 2019-05-28 DIAGNOSIS — M25551 Pain in right hip: Secondary | ICD-10-CM | POA: Diagnosis not present

## 2019-05-28 NOTE — Therapy (Addendum)
[place], [place], [address] Phone: [phone]   Fax:  [phone]  Physical Therapy Treatment  Patient Details  Name: Julia Griffith MRN: [id number] Date of Birth: 1970/10/15 Referring Provider (PT): Silverio Decamp, MD   Encounter Date: 05/28/2019  PT End of Session - 05/28/19 0804    Visit Number  15    Number of Visits  18    Date for PT Re-Evaluation  05/31/19    PT Start Time  0803    PT Stop Time  0844   moist heat end of treatment   PT Time Calculation (min)  41 min    Activity Tolerance  Patient tolerated treatment well       Past Medical History:  Diagnosis Date  . Osteoarthritis of right hip 10/19/2012  . Postpartum depression     Past Surgical History:  Procedure Laterality Date  . LEEP    . Right TM repair    . thumb and middle finger surgery     overuse injury    There were no vitals filed for this visit.  Subjective Assessment - 05/28/19 0804    Subjective  Doing well. Some tightness on an intermittent basis. Working on exercises at home and feels ready to graduate from PT    Currently in Pain?  No/denies         [facility] PT Assessment - 05/28/19 0001      Assessment   Medical Diagnosis  Primary OA of both hips    Referring Provider (PT)  Silverio Decamp, MD    Onset Date/Surgical Date  --   initial onset 8 years ago;  referral 02/24/19   Hand Dominance  Right    Next MD Visit  PRN      AROM   Overall AROM Comments  ROM in WFL's bilat hip and pain free       PROM   Right Hip Extension  --   WFL's      Strength   Right Hip Flexion  5/5    Right Hip Extension  5/5    Right Hip ABduction  5/5    Left Hip Flexion  5/5    Left Hip Extension  5/5    Left Hip ABduction  5/5      Flexibility   Quadriceps  minimal tightness Rt > Lt       Palpation   Palpation comment  minimal tightness noted in the Rt flexors and medial thigh/adductors and  quads                     [facility] Adult PT Treatment/Exercise - 05/28/19 0001      Knee/Hip Exercises: Stretches   Passive Hamstring Stretch  Right;2 reps;30 seconds   supine with strap    Hip Flexor Stretch  Right;2 reps;20 seconds    Hip Flexor Stretch Limitations  supine Thomas position     ITB Stretch  Right;2 reps;20 seconds    Piriformis Stretch  Right;2 reps;20 seconds    Other Knee/Hip Stretches  Rt supine adductor stretch x 2 reps of 20 sec       Knee/Hip Exercises: Prone   Hip Extension  AROM;Strengthening;Right;Left;10 reps      Moist Heat Therapy   Number Minutes Moist Heat  10 Minutes    Moist Heat Location  Hip   right anterior hip and medial thigh      Manual Therapy  Manual Therapy  Soft tissue mobilization;Myofascial release   pt supine and prone    Manual therapy comments  skilled palpation and monitoring during DN    Soft tissue mobilization  deep tissue work through the medial hip and thigh through adductors and quads     Myofascial Release  Rt quads; Rt adductors     Passive ROM  hip rotation pt supine knee flexed to 90 deg        Trigger Point Dry Needling - 05/28/19 0001    Consent Given?  Yes    Education Handout Provided  Previously provided    Dry Needling Comments  pt supine Rt LE only     Electrical Stimulation Performed with Dry Needling  Yes    Quadriceps Response  Palpable increased muscle length    Adductor Response  Palpable increased muscle length    Rectus femoris Response  Palpable increased muscle length    Iliacus Response  Palpable increased muscle length                PT Long Term Goals - 05/28/19 0839      PT LONG TERM GOAL #1   Title  The patient will be independent with HEP for bilateral hip stretching, strengthening, and self mobilization.    Time  12    Period  Weeks    Status  Achieved      PT LONG TERM GOAL #2   Title  The patient will report pain in R hip at rest < or equal to 3/10.    Baseline   0/10    Time  12    Period  Weeks    Status  Achieved      PT LONG TERM GOAL #3   Title  The patient will report functional limitation per FOTO < or equal to 25%.    Baseline  25%    Time  12    Period  Weeks    Status  Achieved      PT LONG TERM GOAL #4   Title  The patient will report no pain in the right hip when negotiating steps.    Time  12    Period  Weeks    Status  Achieved      PT LONG TERM GOAL #5   Title  The patient will improve hip extensor strength to 5/5.    Baseline  5/5    Time  12    Period  Weeks    Status  Achieved            Plan - 05/28/19 3094    Clinical Impression Statement  Excellent progress with decreased pain and increased mobility and strength through Rt hip/LE. Patient is independent in HEP. Goals of therapy have been accomplished. She will call with any questions or problems.    Rehab Potential  Good    PT Frequency  1x / week    PT Duration  6 weeks    PT Treatment/Interventions  ADLs/Self Care Home Management;Gait training;Stair training;Functional mobility training;Therapeutic activities;Aquatic Therapy;Cryotherapy;Electrical Stimulation;Iontophoresis 94m/ml Dexamethasone;Moist Heat;Therapeutic exercise;Balance training;Neuromuscular re-education;Manual techniques;Dry needling;Taping;Patient/family education    PT Next Visit Plan  on hold will call with problems or questions    PT Home Exercise Plan  HEP    Consulted and Agree with Plan of Care  Patient       Patient will benefit from skilled therapeutic intervention in order to improve the following deficits and impairments:  Visit Diagnosis: Pain in right hip  Other symptoms and signs involving the musculoskeletal system  Muscle weakness (generalized)     Problem List Patient Active Problem List   Diagnosis Date Noted  . Primary osteoarthritis of both hips 01/20/2017  . Chronic right shoulder pain 01/20/2017  . Cough 08/19/2014  . Pertussis exposure 08/19/2014  .  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 ALLERGIC RHINITIS 05/29/2009  . BACK PAIN, THORACIC REGION 03/15/2009  . FOOT PAIN, LEFT 01/18/2009  . IRON DEFICIENCY 11/16/2007  . DIZZINESS 11/16/2007    Dondre Catalfamo Nilda Simmer PT, MPH  05/28/2019, 8:41 AM  [facility] [place], [place], [address] Phone: [phone]   Fax:  [phone]  Name: Julia Griffith MRN: [id number] Date of Birth: 1970/05/04  PHYSICAL THERAPY DISCHARGE SUMMARY  Visits from Start of Care: 15  Current functional level related to goals / functional outcomes: See last progress note for discharge status    Remaining deficits: Needs to continue with stretching and strengthening. Joint conservation measures.     Education / Equipment: HEP  Plan: Patient agrees to discharge.  Patient goals were met. Patient is being discharged due to meeting the stated rehab goals.  ?????     Amador Braddy P. Helene Kelp PT, MPH 07/08/19 3:37 PM

## 2019-06-24 ENCOUNTER — Ambulatory Visit (INDEPENDENT_AMBULATORY_CARE_PROVIDER_SITE_OTHER): Payer: No Typology Code available for payment source | Admitting: Family Medicine

## 2019-06-24 ENCOUNTER — Other Ambulatory Visit: Payer: Self-pay

## 2019-06-24 ENCOUNTER — Encounter: Payer: Self-pay | Admitting: Family Medicine

## 2019-06-24 VITALS — BP 134/59 | HR 64 | Ht 62.0 in | Wt 151.0 lb

## 2019-06-24 DIAGNOSIS — H01136 Eczematous dermatitis of left eye, unspecified eyelid: Secondary | ICD-10-CM

## 2019-06-24 DIAGNOSIS — I8391 Asymptomatic varicose veins of right lower extremity: Secondary | ICD-10-CM

## 2019-06-24 MED ORDER — HYDROCORTISONE VALERATE 0.2 % EX CREA: 1.0000 "application " | TOPICAL_CREAM | Freq: Two times a day (BID) | CUTANEOUS | 0 refills | Status: DC

## 2019-06-24 NOTE — Patient Instructions (Signed)
Please schedule your Pap smear at your convenience.

## 2019-06-24 NOTE — Progress Notes (Signed)
Established Patient Office Visit  Subjective:  Patient ID: Julia Griffith, female    DOB: [birth year]  Age: 49 y.o. MRN: [id number]  CC:  Chief Complaint  Patient presents with  . Follow-up    HPI Julia Griffith presents for varicose veins.  Says has been getting worse in the last year. Now tender to touch and causes pain and bulging with exercise.  She says initially it was just on her lower leg below her knee and now it is moving downward and crossing over to her upper thigh.  She also has a rash around the corner of her left eye.  Using colloidal silver and Vaseline but just does not seem to be going away.  Happened after she was mowing her grass about 2 weeks ago.  She is not sure if that may be what sort of triggered it..  She says she does tend to sleep on her left side and so sometimes I will water on that side.  Is itchy specifically after exercise and workout.  He did see her eye doctor about it and they felt that it was likely just dry skin but did not recommend any specific treatment.  Past Medical History:  Diagnosis Date  . Osteoarthritis of right hip 10/19/2012  . Postpartum depression     Past Surgical History:  Procedure Laterality Date  . LEEP    . Right TM repair    . thumb and middle finger surgery     overuse injury    Family History  Problem Relation Age of Onset  . Skin cancer Mother        Basal cell  . Cancer Mother        skin    Social History   Socioeconomic History  . Marital status: Married    Spouse name: Fayrene Fearing   . Number of children: 4   . Years of education: Not on file  . Highest education level: Not on file  Occupational History  . Occupation: Post office   Tobacco Use  . Smoking status: Former Smoker    Quit date: 01/08/2000    Years since quitting: 19.4  . Smokeless tobacco: Never Used  Substance and Sexual Activity  . Alcohol use: Yes  . Drug use: No  . Sexual activity: Not on file  Other Topics Concern  . Not on file    Social History Narrative   Dentist.  Caffeine 2 daily.    Social Determinants of Health   Financial Resource Strain:   . Difficulty of Paying Living Expenses:   Food Insecurity:   . Worried About Programme researcher, broadcasting/film/video in the Last Year:   . Barista in the Last Year:   Transportation Needs:   . Freight forwarder (Medical):   Marland Kitchen Lack of Transportation (Non-Medical):   Physical Activity:   . Days of Exercise per Week:   . Minutes of Exercise per Session:   Stress:   . Feeling of Stress :   Social Connections:   . Frequency of Communication with Friends and Family:   . Frequency of Social Gatherings with Friends and Family:   . Attends Religious Services:   . Active Member of Clubs or Organizations:   . Attends Banker Meetings:   Marland Kitchen Marital Status:   Intimate Partner Violence:   . Fear of Current or Ex-Partner:   . Emotionally Abused:   Marland Kitchen Physically Abused:   . Sexually Abused:  Outpatient Medications Prior to Visit  Medication Sig Dispense Refill  . celecoxib (CELEBREX) 200 MG capsule One to 2 tablets by mouth daily as needed for pain. 60 capsule 2  . cholecalciferol (VITAMIN D) 1000 UNITS tablet Take 1,000 Units by mouth daily.    . fish oil-omega-3 fatty acids 1000 MG capsule Take 2 g by mouth daily.    Marland Kitchen MISC NATURAL PRODUCTS PO Take by mouth. Curamin    . Niacin (VITAMIN B-3 PO) Take by mouth daily.    . vitamin C (ASCORBIC ACID) 500 MG tablet Take 500 mg by mouth daily.     No facility-administered medications prior to visit.    No Known Allergies  ROS Review of Systems    Objective:    Physical Exam Vitals reviewed.  Constitutional:      Appearance: She is well-developed.  HENT:     Head: Normocephalic and atraumatic.  Eyes:     Conjunctiva/sclera: Conjunctivae normal.  Cardiovascular:     Rate and Rhythm: Normal rate.  Pulmonary:     Effort: Pulmonary effort is normal.  Skin:    General: Skin is dry.      Coloration: Skin is not pale.     Comments: On the corner of the left eyelid she has a little bit of erythema with some very fine scale.  Going a little bit towards her under eyelid she does have a little bit of puffiness underneath the left eye as well.  Nothing going across the upper lid.  Neurological:     Mental Status: She is alert and oriented to person, place, and time.  Psychiatric:        Behavior: Behavior normal.              BP (!) 134/59   Pulse 64   Ht 5\' 2"  (1.575 m)   Wt 151 lb (68.5 kg)   SpO2 95%   BMI 27.62 kg/m  Wt Readings from Last 3 Encounters:  06/24/19 151 lb (68.5 kg)  02/24/19 155 lb (70.3 kg)  03/20/17 165 lb (74.8 kg)     There are no preventive care reminders to display for this patient.  There are no preventive care reminders to display for this patient.     Assessment & Plan:   Problem List Items Addressed This Visit    None    Visit Diagnoses    Varicose veins of right lower extremity, unspecified whether complicated    -  Primary   Relevant Orders   Ambulatory referral to Vascular Surgery   Eczematous dermatitis of eyelid of left eye, unspecified eyelid       Relevant Medications   hydrocortisone valerate cream (WESTCORT) 0.2 %      Varicose vein-recommend referral to vein specialist for more definitive treatment.  We did discuss how compression stockings can be really helpful in further worsening and prevention of new veins.  They can discuss treatment options with her.  Eczema/dermatitis of the left eyelid recommend a trial of a topical steroid we will start with hydrocortisone 0.2% cream.  Call if not improving.  Meds ordered this encounter  Medications  . hydrocortisone valerate cream (WESTCORT) 0.2 %    Sig: Apply 1 application topically 2 (two) times daily.    Dispense:  15 g    Refill:  0    Follow-up: Return if symptoms worsen or fail to improve.    Beatrice Lecher, MD

## 2019-08-03 ENCOUNTER — Ambulatory Visit (INDEPENDENT_AMBULATORY_CARE_PROVIDER_SITE_OTHER): Payer: No Typology Code available for payment source | Admitting: Family Medicine

## 2019-08-03 ENCOUNTER — Encounter: Payer: Self-pay | Admitting: Family Medicine

## 2019-08-03 ENCOUNTER — Other Ambulatory Visit: Payer: Self-pay

## 2019-08-03 ENCOUNTER — Other Ambulatory Visit (HOSPITAL_COMMUNITY)
Admission: RE | Admit: 2019-08-03 | Discharge: 2019-08-03 | Disposition: A | Discharge status: Home / Self Care | Payer: No Typology Code available for payment source | Source: Ambulatory Visit | Attending: Family Medicine | Admitting: Family Medicine

## 2019-08-03 VITALS — BP 127/57 | HR 62 | Ht 62.0 in | Wt 154.0 lb

## 2019-08-03 DIAGNOSIS — Z1231 Encounter for screening mammogram for malignant neoplasm of breast: Secondary | ICD-10-CM

## 2019-08-03 DIAGNOSIS — Z0000 Encounter for general adult medical examination without abnormal findings: Secondary | ICD-10-CM

## 2019-08-03 DIAGNOSIS — Z124 Encounter for screening for malignant neoplasm of cervix: Secondary | ICD-10-CM

## 2019-08-03 LAB — CBC
HCT: 39.4 % (ref 35.0–45.0)
Hemoglobin: 13.5 g/dL (ref 11.7–15.5)
MCH: 31.6 pg (ref 27.0–33.0)
MCHC: 34.3 g/dL (ref 32.0–36.0)
MCV: 92.3 fL (ref 80.0–100.0)
MPV: 10.7 fL (ref 7.5–12.5)
Platelets: 271 10*3/uL (ref 140–400)
RBC: 4.27 10*6/uL (ref 3.80–5.10)
RDW: 11.7 % (ref 11.0–15.0)
WBC: 7.1 10*3/uL (ref 3.8–10.8)

## 2019-08-03 LAB — LIPID PANEL
Cholesterol: 198 mg/dL (ref <200)
HDL: 56 mg/dL (ref >=50)
LDL Cholesterol (Calc): 117 mg/dL (calc) — ABNORMAL HIGH
Non-HDL Cholesterol (Calc): 142 mg/dL (calc) — ABNORMAL HIGH (ref <130)
Total CHOL/HDL Ratio: 3.5 (calc) (ref <5.0)
Triglycerides: 141 mg/dL (ref <150)

## 2019-08-03 LAB — COMPLETE METABOLIC PANEL WITH GFR
AG Ratio: 1.6 (calc) (ref 1.0–2.5)
ALT: 10 U/L (ref 6–29)
AST: 14 U/L (ref 10–35)
Albumin: 4.1 g/dL (ref 3.6–5.1)
Alkaline phosphatase (APISO): 50 U/L (ref 31–125)
BUN: 13 mg/dL (ref 7–25)
CO2: 25 mmol/L (ref 20–32)
Calcium: 9 mg/dL (ref 8.6–10.2)
Chloride: 104 mmol/L (ref 98–110)
Creat: 0.76 mg/dL (ref 0.50–1.10)
GFR, Est African American: 109 mL/min/{1.73_m2} (ref >=60)
GFR, Est Non African American: 94 mL/min/{1.73_m2} (ref >=60)
Globulin: 2.5 g/dL (calc) (ref 1.9–3.7)
Glucose, Bld: 93 mg/dL (ref 65–99)
Potassium: 4.8 mmol/L (ref 3.5–5.3)
Sodium: 137 mmol/L (ref 135–146)
Total Bilirubin: 0.4 mg/dL (ref 0.2–1.2)
Total Protein: 6.6 g/dL (ref 6.1–8.1)

## 2019-08-03 NOTE — Patient Instructions (Signed)
Health Maintenance, Female Adopting a healthy lifestyle and getting preventive care are important in promoting health and wellness. Ask your health care provider about:  The right schedule for you to have regular tests and exams.  Things you can do on your own to prevent diseases and keep yourself healthy. What should I know about diet, weight, and exercise? Eat a healthy diet   Eat a diet that includes plenty of vegetables, fruits, low-fat dairy products, and lean protein.  Do not eat a lot of foods that are high in solid fats, added sugars, or sodium. Maintain a healthy weight Body mass index (BMI) is used to identify weight problems. It estimates body fat based on height and weight. Your health care provider can help determine your BMI and help you achieve or maintain a healthy weight. Get regular exercise Get regular exercise. This is one of the most important things you can do for your health. Most adults should:  Exercise for at least 150 minutes each week. The exercise should increase your heart rate and make you sweat (moderate-intensity exercise).  Do strengthening exercises at least twice a week. This is in addition to the moderate-intensity exercise.  Spend less time sitting. Even light physical activity can be beneficial. Watch cholesterol and blood lipids Have your blood tested for lipids and cholesterol at 47 years of age, then have this test every 5 years. Have your cholesterol levels checked more often if:  Your lipid or cholesterol levels are high.  You are older than 47 years of age.  You are at high risk for heart disease. What should I know about cancer screening? Depending on your health history and family history, you may need to have cancer screening at various ages. This may include screening for:  Breast cancer.  Cervical cancer.  Colorectal cancer.  Skin cancer.  Lung cancer. What should I know about heart disease, diabetes, and high blood  pressure? Blood pressure and heart disease  High blood pressure causes heart disease and increases the risk of stroke. This is more likely to develop in people who have high blood pressure readings, are of African descent, or are overweight.  Have your blood pressure checked: ? Every 3-5 years if you are 18-39 years of age. ? Every year if you are 40 years old or older. Diabetes Have regular diabetes screenings. This checks your fasting blood sugar level. Have the screening done:  Once every three years after age 40 if you are at a normal weight and have a low risk for diabetes.  More often and at a younger age if you are overweight or have a high risk for diabetes. What should I know about preventing infection? Hepatitis B If you have a higher risk for hepatitis B, you should be screened for this virus. Talk with your health care provider to find out if you are at risk for hepatitis B infection. Hepatitis C Testing is recommended for:  Everyone born from 1945 through 1965.  Anyone with known risk factors for hepatitis C. Sexually transmitted infections (STIs)  Get screened for STIs, including gonorrhea and chlamydia, if: ? You are sexually active and are younger than 47 years of age. ? You are older than 47 years of age and your health care provider tells you that you are at risk for this type of infection. ? Your sexual activity has changed since you were last screened, and you are at increased risk for chlamydia or gonorrhea. Ask your health care provider if   you are at risk.  Ask your health care provider about whether you are at high risk for HIV. Your health care provider may recommend a prescription medicine to help prevent HIV infection. If you choose to take medicine to prevent HIV, you should first get tested for HIV. You should then be tested every 3 months for as long as you are taking the medicine. Pregnancy  If you are about to stop having your period (premenopausal) and  you may become pregnant, seek counseling before you get pregnant.  Take 400 to 800 micrograms (mcg) of folic acid every day if you become pregnant.  Ask for birth control (contraception) if you want to prevent pregnancy. Osteoporosis and menopause Osteoporosis is a disease in which the bones lose minerals and strength with aging. This can result in bone fractures. If you are 65 years old or older, or if you are at risk for osteoporosis and fractures, ask your health care provider if you should:  Be screened for bone loss.  Take a calcium or vitamin D supplement to lower your risk of fractures.  Be given hormone replacement therapy (HRT) to treat symptoms of menopause. Follow these instructions at home: Lifestyle  Do not use any products that contain nicotine or tobacco, such as cigarettes, e-cigarettes, and chewing tobacco. If you need help quitting, ask your health care provider.  Do not use street drugs.  Do not share needles.  Ask your health care provider for help if you need support or information about quitting drugs. Alcohol use  Do not drink alcohol if: ? Your health care provider tells you not to drink. ? You are pregnant, may be pregnant, or are planning to become pregnant.  If you drink alcohol: ? Limit how much you use to 0-1 drink a day. ? Limit intake if you are breastfeeding.  Be aware of how much alcohol is in your drink. In the [place] one drink equals one 12 oz bottle of beer (355 mL), one 5 oz glass of wine (148 mL), or one 1 oz glass of hard liquor (44 mL). General instructions  Schedule regular health, dental, and eye exams.  Stay current with your vaccines.  Tell your health care provider if: ? You often feel depressed. ? You have ever been abused or do not feel safe at home. Summary  Adopting a healthy lifestyle and getting preventive care are important in promoting health and wellness.  Follow your health care provider's instructions about healthy  diet, exercising, and getting tested or screened for diseases.  Follow your health care provider's instructions on monitoring your cholesterol and blood pressure. This information is not intended to replace advice given to you by your health care provider. Make sure you discuss any questions you have with your health care provider. Document Revised: 12/17/2017 Document Reviewed: 12/17/2017 Elsevier Patient Education  2020 Elsevier Inc.  

## 2019-08-03 NOTE — Progress Notes (Signed)
Subjective:     Julia Griffith is a 49 y.o. female and is here for a comprehensive physical exam. The patient reports no problems.  She is doing well overall.  She is working out 5 days/week.  Denies any recent chest pain or shortness of breath.  No change in bowels.  Declines tetanus vaccine today.  Mammogram was years ago.  Social History   Socioeconomic History  . Marital status: Married    Spouse name: Fayrene Fearing   . Number of children: 4   . Years of education: Not on file  . Highest education level: Not on file  Occupational History  . Occupation: Post office   Tobacco Use  . Smoking status: Former Smoker    Quit date: 01/08/2000    Years since quitting: 19.5  . Smokeless tobacco: Never Used  Substance and Sexual Activity  . Alcohol use: Yes  . Drug use: No  . Sexual activity: Not on file  Other Topics Concern  . Not on file  Social History Narrative   Dentist.  Caffeine 2 daily.    Social Determinants of Health   Financial Resource Strain:   . Difficulty of Paying Living Expenses:   Food Insecurity:   . Worried About Programme researcher, broadcasting/film/video in the Last Year:   . Barista in the Last Year:   Transportation Needs:   . Freight forwarder (Medical):   Marland Kitchen Lack of Transportation (Non-Medical):   Physical Activity:   . Days of Exercise per Week:   . Minutes of Exercise per Session:   Stress:   . Feeling of Stress :   Social Connections:   . Frequency of Communication with Friends and Family:   . Frequency of Social Gatherings with Friends and Family:   . Attends Religious Services:   . Active Member of Clubs or Organizations:   . Attends Banker Meetings:   Marland Kitchen Marital Status:   Intimate Partner Violence:   . Fear of Current or Ex-Partner:   . Emotionally Abused:   Marland Kitchen Physically Abused:   . Sexually Abused:    Health Maintenance  Topic Date Due  . PAP SMEAR-Modifier  03/15/2020 (Originally 03/16/2019)  . COVID-19 Vaccine (1) 06/23/2020  (Originally 11/02/1984)  . Hepatitis C Screening  07/06/2021 (Originally [birth year])  . INFLUENZA VACCINE  08/08/2019  . TETANUS/TDAP  01/06/2021  . HIV Screening  Completed    The following portions of the patient's history were reviewed and updated as appropriate: allergies, current medications, past family history, past medical history, past social history, past surgical history and problem list.  Review of Systems A comprehensive review of systems was negative.   Objective:    BP (!) 127/57   Pulse 62   Ht 5\' 2"  (1.575 m)   Wt 154 lb (69.9 kg)   SpO2 100%   BMI 28.17 kg/m  General appearance: alert, cooperative and appears stated age Head: Normocephalic, without obvious abnormality, atraumatic Eyes: conj clear, EOM, PEERLA Ears: normal TM's and external ear canals both ears Nose: Nares normal. Septum midline. Mucosa normal. No drainage or sinus tenderness. Throat: lips, mucosa, and tongue normal; teeth and gums normal Neck: no adenopathy, no carotid bruit, no JVD, supple, symmetrical, trachea midline and thyroid not enlarged, symmetric, no tenderness/mass/nodules Back: symmetric, no curvature. ROM normal. No CVA tenderness. Lungs: clear to auscultation bilaterally Breasts: normal appearance, no masses or tenderness Heart: regular rate and rhythm, S1, S2 normal, no murmur, click, rub or gallop  Abdomen: soft, non-tender; bowel sounds normal; no masses,  no organomegaly Pelvic: cervix normal in appearance, external genitalia normal, no adnexal masses or tenderness, no cervical motion tenderness, rectovaginal septum normal, uterus normal size, shape, and consistency and vagina normal without discharge Extremities: extremities normal, atraumatic, no cyanosis or edema Pulses: 2+ and symmetric Skin: Skin color, texture, turgor normal. No rashes or lesions Lymph nodes: Cervical, supraclavicular, and axillary nodes normal. Neurologic: Alert and oriented X 3, normal strength and tone.  Normal symmetric reflexes. Normal coordination and gait    Assessment:    Healthy female exam.      Plan:     See After Visit Summary for Counseling Recommendations   Keep up a regular exercise program and make sure you are eating a healthy diet Try to eat 4 servings of dairy a day, or if you are lactose intolerant take a calcium with vitamin D daily.  Your vaccines are up to date.  Pap performed today.  Will call with results once available. Declined Tdap.

## 2019-08-04 LAB — CYTOLOGY - PAP
Comment: NEGATIVE
Diagnosis: NEGATIVE
High risk HPV: NEGATIVE

## 2019-08-04 NOTE — Progress Notes (Signed)
Call patient: Your Pap smear is normal. Repeat in 5 years.

## 2020-06-01 ENCOUNTER — Ambulatory Visit (INDEPENDENT_AMBULATORY_CARE_PROVIDER_SITE_OTHER): Payer: No Typology Code available for payment source | Admitting: Family Medicine

## 2020-06-01 ENCOUNTER — Encounter: Payer: Self-pay | Admitting: Family Medicine

## 2020-06-01 ENCOUNTER — Other Ambulatory Visit: Payer: Self-pay

## 2020-06-01 VITALS — BP 146/78 | HR 62 | Wt 157.0 lb

## 2020-06-01 DIAGNOSIS — S39011A Strain of muscle, fascia and tendon of abdomen, initial encounter: Secondary | ICD-10-CM | POA: Diagnosis not present

## 2020-06-01 DIAGNOSIS — R109 Unspecified abdominal pain: Secondary | ICD-10-CM

## 2020-06-01 DIAGNOSIS — R10A0 Flank pain, unspecified side: Secondary | ICD-10-CM

## 2020-06-01 LAB — POCT URINALYSIS DIPSTICK
Bilirubin, UA: NEGATIVE
Blood, UA: NEGATIVE
Glucose, UA: NEGATIVE
Ketones, UA: NEGATIVE
Leukocytes, UA: NEGATIVE
Nitrite, UA: NEGATIVE
Protein, UA: NEGATIVE
Spec Grav, UA: 1.015 (ref 1.010–1.025)
Urobilinogen, UA: 0.2 E.U./dL
pH, UA: 6.5 (ref 5.0–8.0)

## 2020-06-01 NOTE — Assessment & Plan Note (Signed)
UA negative. Recommend continued NSAID, declines rx and will continue ibuprofen.  She can try heat to area as well.  Referral placed to physical therapy.  Follow up if developing new or worsening symptoms.

## 2020-06-01 NOTE — Patient Instructions (Signed)
Try ibuprofen 600mg  every 6-8 hours as needed.  Try heat to area.  Referral placed to physical therapy.
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
## 2020-06-02 ENCOUNTER — Encounter: Payer: Self-pay | Admitting: Physical Therapy

## 2020-06-02 ENCOUNTER — Ambulatory Visit (INDEPENDENT_AMBULATORY_CARE_PROVIDER_SITE_OTHER): Payer: No Typology Code available for payment source | Admitting: Physical Therapy

## 2020-06-02 DIAGNOSIS — M6281 Muscle weakness (generalized): Secondary | ICD-10-CM | POA: Diagnosis not present

## 2020-06-02 DIAGNOSIS — M25551 Pain in right hip: Secondary | ICD-10-CM | POA: Diagnosis not present

## 2020-06-02 DIAGNOSIS — R29898 Other symptoms and signs involving the musculoskeletal system: Secondary | ICD-10-CM | POA: Diagnosis not present

## 2020-06-02 DIAGNOSIS — M545 Low back pain, unspecified: Secondary | ICD-10-CM

## 2020-06-02 NOTE — Patient Instructions (Signed)
Access Code: [email or web address] URL: [email or web address] Date: 06/02/2020 Prepared by: Reggy [facility]  Exercises Supine Piriformis Stretch with Foot on Ground - 1 x daily - 7 x weekly - 3 sets - 1 reps - 20-30 seconds hold Supine Figure 4 Piriformis Stretch - 1 x daily - 7 x weekly - 3 sets - 1 reps - 20-30 sec hold Supine Bilateral Hip Internal Rotation Stretch - 1 x daily - 7 x weekly - 1 sets - 10 reps - 5 seconds hold Child's Pose Stretch - 1 x daily - 7 x weekly - 3 sets - 1 reps - 20-30 seconds hold Child's Pose with Sidebending - 1 x daily - 7 x weekly - 3 sets - 1 reps - 20-30 seconds hold
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
## 2020-06-07 ENCOUNTER — Ambulatory Visit: Payer: Self-pay | Admitting: Rehabilitative and Restorative Service Providers"

## 2020-06-07 ENCOUNTER — Ambulatory Visit (INDEPENDENT_AMBULATORY_CARE_PROVIDER_SITE_OTHER): Payer: No Typology Code available for payment source | Admitting: Physical Therapy

## 2020-06-07 ENCOUNTER — Encounter: Payer: Self-pay | Admitting: Physical Therapy

## 2020-06-07 ENCOUNTER — Other Ambulatory Visit: Payer: Self-pay

## 2020-06-07 DIAGNOSIS — M545 Low back pain, unspecified: Secondary | ICD-10-CM

## 2020-06-07 DIAGNOSIS — M25551 Pain in right hip: Secondary | ICD-10-CM

## 2020-06-07 DIAGNOSIS — R29898 Other symptoms and signs involving the musculoskeletal system: Secondary | ICD-10-CM | POA: Diagnosis not present

## 2020-06-07 DIAGNOSIS — M6281 Muscle weakness (generalized): Secondary | ICD-10-CM

## 2020-06-07 NOTE — Therapy (Signed)
[facility] Outpatient Rehabilitation [place] [address] [place], [place], [address] Phone: [phone]   Fax:  [phone]  Physical Therapy Treatment  Patient Details  Name: Julia Griffith MRN: [id number] Date of Birth: August 20, 1970 Referring Provider (PT): Everrett Coombe   Encounter Date: 06/07/2020   PT End of Session - 06/07/20 1448    Visit Number 2    Number of Visits 12    Date for PT Re-Evaluation 07/14/20    PT Start Time 1438   pt arrived late   PT Stop Time 1516    PT Time Calculation (min) 38 min    Activity Tolerance Patient tolerated treatment well    Behavior During Therapy [facility] for tasks assessed/performed           Past Medical History:  Diagnosis Date  . Osteoarthritis of right hip 10/19/2012  . Postpartum depression     Past Surgical History:  Procedure Laterality Date  . LEEP    . Right TM repair    . thumb and middle finger surgery     overuse injury    There were no vitals filed for this visit.   Subjective Assessment - 06/07/20 1441    Subjective Pt reports her low back pain was so bad this weekend, she didn't do much but use medication and TENS machine. It felt a little worse with doing water aerobics this morning; reduces with rest.  she has felt a heaviness in abdomen, that radiates to back "like back labor".    Pertinent History Rt hip arthritis    Currently in Pain? Yes    Pain Score 2     Pain Location Back    Pain Orientation Right;Left;Lower    Pain Descriptors / Indicators Aching;Sore    Aggravating Factors  jarring motion (like jogging)    Pain Relieving Factors TENS              [facility] PT Assessment - 06/07/20 0001      Assessment   Medical Diagnosis strain of abdominal wall    Referring Provider (PT) Everrett Coombe            [facility] Adult PT Treatment/Exercise - 06/07/20 0001      Lumbar Exercises: Stretches   Single Knee to Chest Stretch Limitations limited tolerance    Lower Trunk  Rotation 4 reps;10 seconds   wide feet   Hip Flexor Stretch Right;2 reps;Left;1 rep;30 seconds   with overhead readh   Prone on Elbows Stretch 1 rep;20 seconds   limited tolerance   Piriformis Stretch Right;1 rep;30 seconds    Figure 4 Stretch 2 reps;20 seconds   RLE   Other Lumbar Stretch Exercise childs pose x 2, repeated with Lt lateral trunk flexion    Other Lumbar Stretch Exercise standing adductor stretch.   Lt sidelying over black bolster and arm overhead for QL stretch x 1 min      Lumbar Exercises: Quadruped   Madcat/Old Horse 5 reps    Madcat/Old Horse Limitations 5 reps, wag the tail    Other Quadruped Lumbar Exercises reaching for ceiling for thoracic rotation x 3 reps each      Manual Therapy   Soft tissue mobilization TPR to Rt adductors, QL, piriformis, glute med,.                       PT Long Term Goals - 06/02/20 1115      PT LONG TERM GOAL #1  Title Pt will be independent with HEP    Time 6    Period Weeks    Status New    Target Date 07/14/20      PT LONG TERM GOAL #2   Title Pt will improve lumbar ROM by 25% in all directions to be able to perform transitions with decreased pain    Time 6    Period Weeks    Status New    Target Date 07/14/20      PT LONG TERM GOAL #3   Title Pt will return to HIIT training with pain <= 2/10    Time 6    Period Weeks    Status New    Target Date 07/14/20                 Plan - 06/07/20 1644    Clinical Impression Statement Overall pain level has improved.  Continued limitation and tightness in Rt hip ER/IR and adduction.  Pt reported relief with Lt sidelying stretch over bolster to open up Rt low back.  She reported slight reduction of pain at end of session.  Encouraged pt to add Rt hip flexor stretch to pool routine.  Goals are ongoing.    Personal Factors and Comorbidities Past/Current Experience    Examination-Activity Limitations Lift;Carry;Locomotion Level;Stand;Transfers     Examination-Participation Restrictions Cleaning;Community Activity;Occupation;Yard Work    Stability/Clinical Decision Making Stable/Uncomplicated    Rehab Potential Good    PT Frequency 2x / week    PT Duration 6 weeks    PT Treatment/Interventions Taping;Dry needling;Passive range of motion;Manual techniques;Patient/family education;Neuromuscular re-education;Therapeutic exercise;Therapeutic activities;Functional mobility training;Traction;Moist Heat;Iontophoresis 4mg /ml Dexamethasone;Electrical Stimulation;Cryotherapy    PT Next Visit Plan manual/modalties as needed, progress core strength and flexibility. DN/manual to Rt adductors.    PT Home Exercise Plan [id number]    Consulted and Agree with Plan of Care Patient           Patient will benefit from skilled therapeutic intervention in order to improve the following deficits and impairments:  Difficulty walking,Hypomobility,Increased muscle spasms,Decreased range of motion,Decreased activity tolerance,Decreased strength,Impaired flexibility,Pain  Visit Diagnosis: No diagnosis found.     Problem List Patient Active Problem List   Diagnosis Date Noted  . Sprain of abdominal wall 06/01/2020  . Primary osteoarthritis of both hips 01/20/2017  . Chronic right shoulder pain 01/20/2017  . 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 ALLERGIC RHINITIS 05/29/2009  . BACK PAIN, THORACIC REGION 03/15/2009  . FOOT PAIN, LEFT 01/18/2009  . IRON DEFICIENCY 11/16/2007  . DIZZINESS 11/16/2007   13/09/2007, PTA 06/07/20 4:55 PM  [facility] Outpatient Rehabilitation [place] [address] [place], [place], [place] Phone: [phone]   Fax:  [phone]  Name: Julia Griffith MRN: Larita Fife Date of Birth: 07/26/1970

## 2020-06-14 ENCOUNTER — Other Ambulatory Visit: Payer: Self-pay

## 2020-06-14 ENCOUNTER — Ambulatory Visit (INDEPENDENT_AMBULATORY_CARE_PROVIDER_SITE_OTHER): Payer: No Typology Code available for payment source | Admitting: Physical Therapy

## 2020-06-14 DIAGNOSIS — R29898 Other symptoms and signs involving the musculoskeletal system: Secondary | ICD-10-CM | POA: Diagnosis not present

## 2020-06-14 DIAGNOSIS — M25551 Pain in right hip: Secondary | ICD-10-CM

## 2020-06-14 DIAGNOSIS — M6281 Muscle weakness (generalized): Secondary | ICD-10-CM | POA: Diagnosis not present

## 2020-06-14 NOTE — Therapy (Signed)
[facility] Outpatient Rehabilitation [place] [address] [place] [address] [place], [place], [address] Phone: [phone]   Fax:  [phone]  Physical Therapy Treatment  Patient Details  Name: Cleveland Paiz MRN: [id number] Date of Birth: 12-01-1970 Referring Provider (PT): Everrett Coombe   Encounter Date: 06/14/2020   PT End of Session - 06/14/20 1016    Visit Number 3    Number of Visits 12    Date for PT Re-Evaluation 07/14/20    PT Start Time 0930    PT Stop Time 1015    PT Time Calculation (min) 45 min    Activity Tolerance Patient tolerated treatment well    Behavior During Therapy [facility] for tasks assessed/performed           Past Medical History:  Diagnosis Date  . Osteoarthritis of right hip 10/19/2012  . Postpartum depression     Past Surgical History:  Procedure Laterality Date  . LEEP    . Right TM repair    . thumb and middle finger surgery     overuse injury    There were no vitals filed for this visit.   Subjective Assessment - 06/14/20 0933    Subjective Pt had procedure on her veins on Rt LE a few days ago, wearing thigh high compression sock on Rt. Pt states she feels her pain is in the "core" of her hip    Patient Stated Goals decrease pain    Currently in Pain? Yes    Pain Score 2     Pain Location Hip    Pain Orientation Right    Pain Descriptors / Indicators Aching              [facility] PT Assessment - 06/14/20 0001      Assessment   Medical Diagnosis strain of abdominal wall    Referring Provider (PT) Everrett Coombe      Palpation   Palpation comment TTP Rt adductors and glutes                         [facility] Adult PT Treatment/Exercise - 06/14/20 0001      Lumbar Exercises: Stretches   Hip Flexor Stretch Right;2 reps;Left;1 rep;30 seconds   with overhead reach   Piriformis Stretch Right;1 rep;30 seconds    Figure 4 Stretch 2 reps;20 seconds   RLE   Other Lumbar Stretch Exercise childs pose x 2, repeated  with Lt lateral trunk flexion    Other Lumbar Stretch Exercise standing hip adduction stretch      Lumbar Exercises: Aerobic   Nustep L5 x 5 min for warm up      Manual Therapy   Manual therapy comments skilled palpation to assess effects of dry needling    Soft tissue mobilization STM Rt glutes and adductors            Trigger Point Dry Needling - 06/14/20 0001    Consent Given? Yes    Education Handout Provided Previously provided    Muscles Treated Lower Quadrant Adductor longus/brevis/magnus    Adductor Response Twitch response elicited    Gluteus Minimus Response Palpable increased muscle length    Gluteus Medius Response Palpable increased muscle length    Gluteus Maximus Response Palpable increased muscle length    Piriformis Response Twitch response elicited                     PT Long Term Goals - 06/02/20 1115  PT LONG TERM GOAL #1   Title Pt will be independent with HEP    Time 6    Period Weeks    Status New    Target Date 07/14/20      PT LONG TERM GOAL #2   Title Pt will improve lumbar ROM by 25% in all directions to be able to perform transitions with decreased pain    Time 6    Period Weeks    Status New    Target Date 07/14/20      PT LONG TERM GOAL #3   Title Pt will return to HIIT training with pain <= 2/10    Time 6    Period Weeks    Status New    Target Date 07/14/20                 Plan - 06/14/20 1017    Clinical Impression Statement Pt continues with increased mm spasticity in adductors and glutes, responds well to dry needling.  Pt with good relief with stretching and PT encouraged pt to focus on stretching on land and in the pool.    PT Next Visit Plan progress core strength and flexibility, dry needling as indicated.    PT Home Exercise Plan [id number]    Consulted and Agree with Plan of Care Patient           Patient will benefit from skilled therapeutic intervention in order to improve the following  deficits and impairments:     Visit Diagnosis: Muscle weakness (generalized)  Other symptoms and signs involving the musculoskeletal system  Pain in right hip     Problem List Patient Active Problem List   Diagnosis Date Noted  . Sprain of abdominal wall 06/01/2020  . Primary osteoarthritis of both hips 01/20/2017  . Chronic right shoulder pain 01/20/2017  . 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 ALLERGIC RHINITIS 05/29/2009  . BACK PAIN, THORACIC REGION 03/15/2009  . FOOT PAIN, LEFT 01/18/2009  . IRON DEFICIENCY 11/16/2007  . DIZZINESS 11/16/2007   Jahniah Pallas, PT  Consuella Scurlock 06/14/2020, 10:20 AM  [facility] [address] [place], [place], [address] Phone: [phone]   Fax:  [phone]  Name: Yesennia Hirota MRN: [id number] Date of Birth: February 18, 1970

## 2020-06-21 ENCOUNTER — Ambulatory Visit (INDEPENDENT_AMBULATORY_CARE_PROVIDER_SITE_OTHER): Payer: No Typology Code available for payment source | Admitting: Physical Therapy

## 2020-06-21 ENCOUNTER — Other Ambulatory Visit: Payer: Self-pay

## 2020-06-21 DIAGNOSIS — R29898 Other symptoms and signs involving the musculoskeletal system: Secondary | ICD-10-CM | POA: Diagnosis not present

## 2020-06-21 DIAGNOSIS — M25551 Pain in right hip: Secondary | ICD-10-CM | POA: Diagnosis not present

## 2020-06-21 DIAGNOSIS — M6281 Muscle weakness (generalized): Secondary | ICD-10-CM | POA: Diagnosis not present

## 2020-06-21 NOTE — Therapy (Signed)
[facility] Outpatient Rehabilitation [place] [address] [place], [place], [address] Phone: [phone]   Fax:  [phone]  Physical Therapy Treatment  Patient Details  Name: Meleana Commerford MRN: [id number] Date of Birth: [birth year] Referring Provider (PT): Everrett Coombe   Encounter Date: 06/21/2020   PT End of Session - 06/21/20 1012     Visit Number 4    Number of Visits 12    Date for PT Re-Evaluation 07/14/20    PT Start Time 0930    PT Stop Time 1010    PT Time Calculation (min) 40 min    Activity Tolerance Patient tolerated treatment well    Behavior During Therapy [facility] for tasks assessed/performed             Past Medical History:  Diagnosis Date   Osteoarthritis of right hip 10/19/2012   Postpartum depression     Past Surgical History:  Procedure Laterality Date   LEEP     Right TM repair     thumb and middle finger surgery     overuse injury    There were no vitals filed for this visit.   Subjective Assessment - 06/21/20 0932     Subjective Pt states she still feels pain in her anterior hip but that her adductors are feeling better    Patient Stated Goals decrease pain    Currently in Pain? Yes    Pain Score 5     Pain Location Hip    Pain Orientation Right    Pain Descriptors / Indicators Aching;Sore                [facility] PT Assessment - 06/21/20 0001       Assessment   Medical Diagnosis strain of abdominal wall    Referring Provider (PT) Everrett Coombe      Palpation   Palpation comment TTP Rt hip flexors                           [facility] Adult PT Treatment/Exercise - 06/21/20 0001       Lumbar Exercises: Stretches   Hip Flexor Stretch Right;2 reps;Left;1 rep;30 seconds   with overhead reach   Prone on Elbows Stretch 30 seconds    Other Lumbar Stretch Exercise thomas stretch 2 x 30 sec right    Other Lumbar Stretch Exercise standing lunge hip flexor stretch and hip adductor stretch       Lumbar Exercises: Aerobic   Nustep L5 x 5 min for warm up      Manual Therapy   Manual Therapy Joint mobilization    Manual therapy comments skilled palpation to assess effects of dry needling    Joint Mobilization long axis hip distraction Rt    Soft tissue mobilization STM Rt adductors and hip flexors              Trigger Point Dry Needling - 06/21/20 0001     Muscles Treated Back/Hip Iliopsoas;Iliacus    Adductor Response Twitch response elicited    Iliacus Response Twitch response elicited;Palpable increased muscle length    Iliopsoas Response Twitch response elicited;Palpable increased muscle length                       PT Long Term Goals - 06/02/20 1115       PT LONG TERM GOAL #1   Title Pt will be independent with HEP    Time 6  Period Weeks    Status New    Target Date 07/14/20      PT LONG TERM GOAL #2   Title Pt will improve lumbar ROM by 25% in all directions to be able to perform transitions with decreased pain    Time 6    Period Weeks    Status New    Target Date 07/14/20      PT LONG TERM GOAL #3   Title Pt will return to HIIT training with pain <= 2/10    Time 6    Period Weeks    Status New    Target Date 07/14/20                   Plan - 06/21/20 1012     Clinical Impression Statement Pt continues to be limited by increased mm spasticity in adductors and hip flexors, decreased mm spasticity in glutes and piriformis. Pt responds well to stretching and dry needling but continues with Rt hip pain    PT Next Visit Plan continue core strength, dry needling    PT Home Exercise Plan [id number]    Consulted and Agree with Plan of Care Patient             Patient will benefit from skilled therapeutic intervention in order to improve the following deficits and impairments:     Visit Diagnosis: Muscle weakness (generalized)  Other symptoms and signs involving the musculoskeletal system  Pain in right  hip     Problem List Patient Active Problem List   Diagnosis Date Noted   Sprain of abdominal wall 06/01/2020   Primary osteoarthritis of both hips 01/20/2017   Chronic right shoulder pain 01/20/2017   Osteoarthritis, hand 10/19/2012   ALLERGIC RHINITIS 05/29/2009   BACK PAIN, THORACIC REGION 03/15/2009   FOOT PAIN, LEFT 01/18/2009   IRON DEFICIENCY 11/16/2007   DIZZINESS 11/16/2007   Leta Bucklin, PT  [place] 06/21/2020, 10:14 AM  [facility] [address] [place] [address] [place], [place], [address] Phone: [phone]   Fax:  [phone]  Name: Salle Brandle MRN: [id number] Date of Birth: 28-Apr-1970

## 2020-06-28 ENCOUNTER — Other Ambulatory Visit: Payer: Self-pay

## 2020-06-28 ENCOUNTER — Ambulatory Visit (INDEPENDENT_AMBULATORY_CARE_PROVIDER_SITE_OTHER): Payer: No Typology Code available for payment source | Admitting: Physical Therapy

## 2020-06-28 DIAGNOSIS — R29898 Other symptoms and signs involving the musculoskeletal system: Secondary | ICD-10-CM | POA: Diagnosis not present

## 2020-06-28 DIAGNOSIS — M25551 Pain in right hip: Secondary | ICD-10-CM

## 2020-06-28 DIAGNOSIS — M6281 Muscle weakness (generalized): Secondary | ICD-10-CM

## 2020-06-28 NOTE — Patient Instructions (Signed)
Germain Osgood PT [phone] [address] [place] [address]  [facility] Physical Therapy [place] [phone] Jonita Albee [phone] [place] [phone]  [facility]: [address] [place], [place] [address] [place]: [address] [place], [place] [address] [place]:[address], [place], [place] [address]

## 2020-06-28 NOTE — Therapy (Signed)
[facility] [place], [place], [address] Phone: [phone]   Fax:  [phone]  Physical Therapy Treatment and Discharge  Patient Details  Name: Julia Griffith MRN: [id number] Date of Birth: [birth year] Referring Provider (PT): Luetta Nutting   Encounter Date: 06/28/2020   PT End of Session - 06/28/20 1508     Visit Number 5    Number of Visits 12    Date for PT Re-Evaluation 07/14/20    PT Start Time 3716    PT Stop Time 1505    PT Time Calculation (min) 30 min    Activity Tolerance Patient tolerated treatment well    Behavior During Therapy [facility] for tasks assessed/performed             Past Medical History:  Diagnosis Date   Osteoarthritis of right hip 10/19/2012   Postpartum depression     Past Surgical History:  Procedure Laterality Date   LEEP     Right TM repair     thumb and middle finger surgery     overuse injury    There were no vitals filed for this visit.   Subjective Assessment - 06/28/20 1438     Subjective Pt states "i just feel sore". States she is going to see the MD about getting an injection    Patient Stated Goals decrease pain    Currently in Pain? Yes    Pain Score 4     Pain Location Hip    Pain Orientation Right    Pain Descriptors / Indicators Aching;Sore                [facility] PT Assessment - 06/28/20 0001       Assessment   Medical Diagnosis strain of abdominal wall    Referring Provider (PT) Luetta Nutting      AROM   Lumbar Flexion [facility]    Lumbar Extension limited 75%    Lumbar - Right Side Bend limited 25%    Lumbar - Left Side Bend limited 25%    Lumbar - Right Rotation limited 25%    Lumbar - Left Rotation limited 25%                           [facility] Adult PT Treatment/Exercise - 06/28/20 0001       Lumbar Exercises: Stretches   Other Lumbar Stretch Exercise standing lunge hip flexor stretch and hip adductor stretch       Lumbar Exercises: Aerobic   Recumbent Bike L3 x 5 min for warm up      Manual Therapy   Manual therapy comments skilled palpation to assess effects of dry needling    Soft tissue mobilization STM Rt adductors, hip flexors              Trigger Point Dry Needling - 06/28/20 0001     Consent Given? Yes    Education Handout Provided Previously provided    Adductor Response Twitch response elicited    Iliopsoas Response Twitch response elicited;Palpable increased muscle length                  PT Education - 06/28/20 1508     Education Details plan for d/c, HEP, pelvic floor PT    Person(s) Educated Patient    Methods Explanation;Demonstration;Handout    Comprehension Returned demonstration;Verbalized understanding  PT Long Term Goals - 06/28/20 1439       PT LONG TERM GOAL #1   Title Pt will be independent with HEP    Status Achieved      PT LONG TERM GOAL #2   Title Pt will improve lumbar ROM by 25% in all directions to be able to perform transitions with decreased pain    Status Partially Met      PT LONG TERM GOAL #3   Title Pt will return to HIIT training with pain <= 2/10    Status Partially Met                   Plan - 06/28/20 1509     Clinical Impression Statement Pt has improved lumbar mobility and activity tolerance. Continues with significant mm spasticity in hip flexors and Rt hip adductors. more pelvic pain today. PT recommends follow up with MD for pelvic floor physical therapy. Pt to d/c from PT services at this time    PT Next Visit Plan d/c    PT Home Exercise Plan [id number]    Consulted and Agree with Plan of Care Patient             Patient will benefit from skilled therapeutic intervention in order to improve the following deficits and impairments:     Visit Diagnosis: Muscle weakness (generalized)  Other symptoms and signs involving the musculoskeletal system  Pain in right  hip     Problem List Patient Active Problem List   Diagnosis Date Noted   Sprain of abdominal wall 06/01/2020   Primary osteoarthritis of both hips 01/20/2017   Chronic right shoulder pain 01/20/2017   Osteoarthritis, hand 10/19/2012   ALLERGIC RHINITIS 05/29/2009   BACK PAIN, THORACIC REGION 03/15/2009   FOOT PAIN, LEFT 01/18/2009   IRON DEFICIENCY 11/16/2007   DIZZINESS 11/16/2007  PHYSICAL THERAPY DISCHARGE SUMMARY  Visits from Start of Care: 5  Current functional level related to goals / functional outcomes: Improved lumbar ROM, improved activity tolerance   Remaining deficits: Mm spasticity, pain   Education / Equipment: HEP   Patient agrees to discharge. Patient goals were partially met. Patient is being discharged due to being pleased with the current functional level.  Julia Griffith, PT  Julia Griffith 06/28/2020, 3:11 PM  [facility] [place], [place], [address] Phone: [phone]   Fax:  [phone]  Name: Julia Griffith MRN: [id number] Date of Birth: January 30, 1970

## 2020-07-03 ENCOUNTER — Ambulatory Visit (INDEPENDENT_AMBULATORY_CARE_PROVIDER_SITE_OTHER): Payer: No Typology Code available for payment source

## 2020-07-03 ENCOUNTER — Ambulatory Visit (INDEPENDENT_AMBULATORY_CARE_PROVIDER_SITE_OTHER): Payer: No Typology Code available for payment source | Admitting: Sports Medicine

## 2020-07-03 ENCOUNTER — Other Ambulatory Visit: Payer: Self-pay

## 2020-07-03 DIAGNOSIS — M160 Bilateral primary osteoarthritis of hip: Secondary | ICD-10-CM | POA: Diagnosis not present

## 2020-07-03 NOTE — Assessment & Plan Note (Addendum)
This is a pleasant 50 year old female, she has hip osteoarthritis and hip impingement syndrome. We injected her over a year ago when she did really well until recently, repeating right hip joint injection today as she does have severe pain with internal rotation of the hip as well as loss of motion. Need updated x-rays. Return to see me in 1 month or as needed.  This is an exacerbation of a chronic process with pharmacologic management.

## 2020-07-03 NOTE — Progress Notes (Signed)
    Procedures performed today:    Procedure: Real-time Ultrasound Guided injection of the right hip joint Device: Samsung HS60  Verbal informed consent obtained.  Time-out conducted.  Noted no overlying erythema, induration, or other signs of local infection.  Skin prepped in a sterile fashion.  Local anesthesia: Topical Ethyl chloride.  With sterile technique and under real time ultrasound guidance: Noted arthritic joint, 1 cc Kenalog 40, 2 cc lidocaine, 2 cc bupivacaine injected easily Completed without difficulty  Advised to call if fevers/chills, erythema, induration, drainage, or persistent bleeding.  Images permanently stored and available for review in PACS.  Impression: Technically successful ultrasound guided injection.  Independent interpretation of notes and tests performed by another provider:   None.  Brief History, Exam, Impression, and Recommendations:    Primary osteoarthritis of both hips This is a pleasant 50 year old female, she has hip osteoarthritis and hip impingement syndrome. We injected her over a year ago when she did really well until recently, repeating right hip joint injection today as she does have severe pain with internal rotation of the hip as well as loss of motion. Need updated x-rays. Return to see me in 1 month or as needed.  This is an exacerbation of a chronic process with pharmacologic management.    ___________________________________________ Ihor Austin. Benjamin Stain, M.D., ABFM., CAQSM. Primary Care and Sports Medicine [facility]  Adjunct Instructor of Family Medicine  [employer]

## 2020-07-05 ENCOUNTER — Encounter: Payer: No Typology Code available for payment source | Admitting: Physical Therapy

## 2020-07-31 ENCOUNTER — Ambulatory Visit: Payer: No Typology Code available for payment source | Admitting: Sports Medicine

## 2021-03-16 ENCOUNTER — Ambulatory Visit (INDEPENDENT_AMBULATORY_CARE_PROVIDER_SITE_OTHER): Payer: No Typology Code available for payment source

## 2021-03-16 ENCOUNTER — Ambulatory Visit (INDEPENDENT_AMBULATORY_CARE_PROVIDER_SITE_OTHER): Payer: No Typology Code available for payment source | Admitting: Sports Medicine

## 2021-03-16 ENCOUNTER — Other Ambulatory Visit: Payer: Self-pay

## 2021-03-16 DIAGNOSIS — M160 Bilateral primary osteoarthritis of hip: Secondary | ICD-10-CM

## 2021-03-16 NOTE — Progress Notes (Signed)
? ? ?  Procedures performed today:   ? ?Procedure: Real-time Ultrasound Guided injection of the right hip joint ?Device: Samsung HS60  ?Verbal informed consent obtained.  ?Time-out conducted.  ?Noted no overlying erythema, induration, or other signs of local infection.  ?Skin prepped in a sterile fashion.  ?Local anesthesia: Topical Ethyl chloride.  ?With sterile technique and under real time ultrasound guidance: Noted arthritic hip, 1 cc Kenalog 40, 2 cc lidocaine, 2 cc bupivacaine injected easily ?Completed without difficulty  ?Advised to call if fevers/chills, erythema, induration, drainage, or persistent bleeding.  ?Images permanently stored and available for review in PACS.  ?Impression: Technically successful ultrasound guided injection. ? ?Independent interpretation of notes and tests performed by another provider:  ? ?None. ? ?Brief History, Exam, Impression, and Recommendations:   ? ?Primary osteoarthritis of both hips ?This is a pleasant 51 year old female, known hip osteoarthritis, hip impingement, her last hip joint injection on the right was in June 2022, she has done well until recently, 9 months of relief. ?Repeated hip joint injection today due to worsening of pain. ?Return to see me as needed. ? ?Chronic process with exacerbation and pharmacologic intervention ? ?___________________________________________ ?Ihor Austin. Benjamin Stain, M.D., ABFM., CAQSM. ?Primary Care and Sports Medicine ?[facility] ? ?Adjunct Instructor of Family Medicine  ?[employer] ?

## 2021-03-16 NOTE — Assessment & Plan Note (Signed)
This is a pleasant 51 year old female, known hip osteoarthritis, hip impingement, her last hip joint injection on the right was in June 2022, she has done well until recently, 9 months of relief. ?Repeated hip joint injection today due to worsening of pain. ?Return to see me as needed. ?

## 2021-09-13 ENCOUNTER — Ambulatory Visit: Payer: No Typology Code available for payment source | Admitting: Family Medicine

## 2021-09-14 ENCOUNTER — Ambulatory Visit: Payer: No Typology Code available for payment source | Admitting: Family Medicine

## 2021-09-18 ENCOUNTER — Ambulatory Visit (INDEPENDENT_AMBULATORY_CARE_PROVIDER_SITE_OTHER): Payer: No Typology Code available for payment source

## 2021-09-18 ENCOUNTER — Encounter: Payer: Self-pay | Admitting: Family Medicine

## 2021-09-18 ENCOUNTER — Ambulatory Visit (INDEPENDENT_AMBULATORY_CARE_PROVIDER_SITE_OTHER): Payer: No Typology Code available for payment source | Admitting: Family Medicine

## 2021-09-18 VITALS — BP 139/58 | HR 72 | Wt 155.1 lb

## 2021-09-18 DIAGNOSIS — R102 Pelvic and perineal pain: Secondary | ICD-10-CM

## 2021-09-18 DIAGNOSIS — R1032 Left lower quadrant pain: Secondary | ICD-10-CM | POA: Diagnosis not present

## 2021-09-18 LAB — POCT URINALYSIS DIP (CLINITEK)
Bilirubin, UA: NEGATIVE
Blood, UA: NEGATIVE
Glucose, UA: NEGATIVE mg/dL
Ketones, POC UA: NEGATIVE mg/dL
Leukocytes, UA: NEGATIVE
Nitrite, UA: NEGATIVE
POC PROTEIN,UA: NEGATIVE
Spec Grav, UA: 1.02 (ref 1.010–1.025)
Urobilinogen, UA: 0.2 E.U./dL
pH, UA: 7 (ref 5.0–8.0)

## 2021-09-18 NOTE — Progress Notes (Signed)
Acute Office Visit  Subjective:     Patient ID: Julia Griffith, female    DOB: April 22, 1970, 51 y.o.   MRN: [id number]  Chief Complaint  Patient presents with   Pelvic Pain    HPI Patient is in today for pelvic pain for a couple of weeks.  Not currently taking any medication for it.  Denies any abnormal discharge.  No new sexual partners.  Pain is definitely much more prominent on the left lower side and the pain is definitely below the bellybutton.  Last period was about 2 weeks ago.  Feels like a very heavy painful menstrual cramp.  Pap smear is up-to-date performed in 2021.  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  ROS      Objective:    BP (!) 139/58   Pulse 72   Wt 155 lb 1.9 oz (70.4 kg)   SpO2 99%   BMI 28.37 kg/m    Physical Exam Vitals reviewed.  Constitutional:      Appearance: She is well-developed.  HENT:     Head: Normocephalic and atraumatic.  Eyes:     Conjunctiva/sclera: Conjunctivae normal.  Cardiovascular:     Rate and Rhythm: Normal rate.  Pulmonary:     Effort: Pulmonary effort is normal.  Abdominal:     Tenderness: There is abdominal tenderness.     Comments: Mild TTP in the LLQ, but no rebound or guarding.  Genitourinary:    General: Normal vulva.     Pubic Area: No rash.      Labia:        Right: No tenderness.        Left: No tenderness.      Vagina: Normal.     Uterus: Normal.      Adnexa:        Right: No mass.         Left: Tenderness present. No mass.       Comments: Some bleeding at os Skin:    General: Skin is dry.     Coloration: Skin is not pale.  Neurological:     Mental Status: She is alert and oriented to person, place, and time.  Psychiatric:        Behavior: Behavior normal.     Results for orders placed or  performed in visit on 09/18/21  POCT URINALYSIS DIP (CLINITEK)  Result Value Ref Range   Color, UA yellow yellow   Clarity, UA clear clear   Glucose, UA negative negative mg/dL   Bilirubin, UA negative negative   Ketones, POC UA negative negative mg/dL   Spec Grav, UA 0.973 5.329 - 1.025   Blood, UA negative negative   pH, UA 7.0 5.0 - 8.0   POC PROTEIN,UA negative negative, trace   Urobilinogen, UA 0.2 0.2 or 1.0 E.U./dL   Nitrite, UA Negative Negative   Leukocytes, UA Negative Negative        Assessment & Plan:   Problem List Items Addressed This Visit   None Visit Diagnoses     Pelvic pain    -  Primary   Relevant Orders   POCT URINALYSIS DIP (CLINITEK) (Completed)   US Pelvic Complete With Transvaginal   LLQ pain  Relevant Orders   POCT URINALYSIS DIP (CLINITEK) (Completed)   US Pelvic Complete With Transvaginal      Pelvic pain worse in the left lower quadrant-just some mild tenderness on exam.  Normal bowel movements no urinary symptoms.  Urinalysis is negative.  Wet prep sent as well as testing for GC and chlamydia though no new sexual partners.  We will schedule for ultrasound for further work-up as well.  No orders of the defined types were placed in this encounter.   Return if symptoms worsen or fail to improve.  Nani Gasser, MD

## 2021-09-18 NOTE — Patient Instructions (Signed)
Katie use Tylenol or Motrin or Aleve for fever and pain relief.

## 2021-09-18 NOTE — Addendum Note (Signed)
Addended by: Deno Etienne on: 09/18/2021 11:25 AM   Modules accepted: Orders

## 2021-09-19 LAB — WET PREP FOR TRICH, YEAST, CLUE
MICRO NUMBER:: 13905167
Specimen Quality: ADEQUATE

## 2021-09-19 LAB — SURESWAB CT/NG/T. VAGINALIS
C. trachomatis RNA, TMA: NOT DETECTED
N. gonorrhoeae RNA, TMA: NOT DETECTED
Trichomonas vaginalis RNA: NOT DETECTED

## 2021-09-19 NOTE — Progress Notes (Signed)
The initial swab looks good no sign of yeast infection overgrowth of bacteria.  Second 1 still pending.

## 2021-09-19 NOTE — Progress Notes (Signed)
Hi Julia Griffith, ultrasound does not show a specific cause for your pain but they did note a small hypoechoic mass measuring 1.3 x 1.2 cm so it is very small.  It could just be a cervical polyp or a small fibroid.  This is not typically large enough to cause the discomfort that you have been experiencing but it does not need additional work-up.  I would like to refer you to a GYN for further evaluation they may decide you need some additional imaging with contrast or maybe even a sampling of the tissue.  Do you have an OB/GYN that you worked with before?  We have OB/GYN here in our building if that is convenient or there is something closer to home or your work that she would prefer then please just let me know.

## 2021-09-20 ENCOUNTER — Other Ambulatory Visit: Payer: Self-pay | Admitting: *Deleted

## 2021-09-20 DIAGNOSIS — R102 Pelvic and perineal pain: Secondary | ICD-10-CM

## 2021-09-20 NOTE — Progress Notes (Signed)
Negative STD testing

## 2021-10-16 IMAGING — DX DG HIP (WITH OR WITHOUT PELVIS) 2-3V*R*
3 series · 3 of 3 positions shown · non-contrast
Comparison: July 08, 2011.

CLINICAL DATA: Evaluate arthritis.

EXAM:
DG HIP (WITH OR WITHOUT PELVIS) 2-3V RIGHT

[pelvis ap]
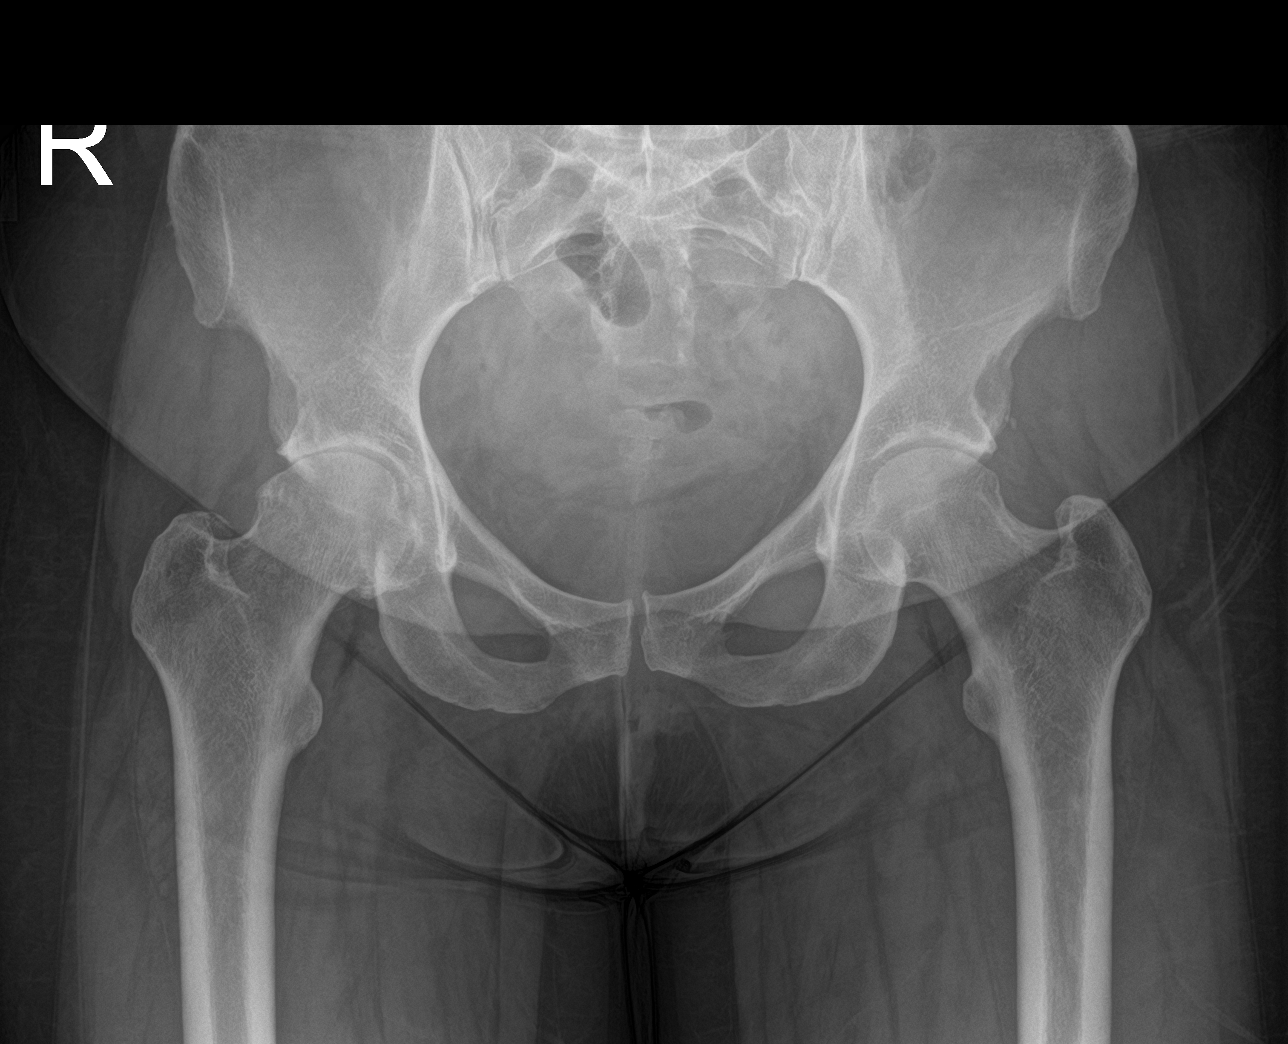

[hip ap]
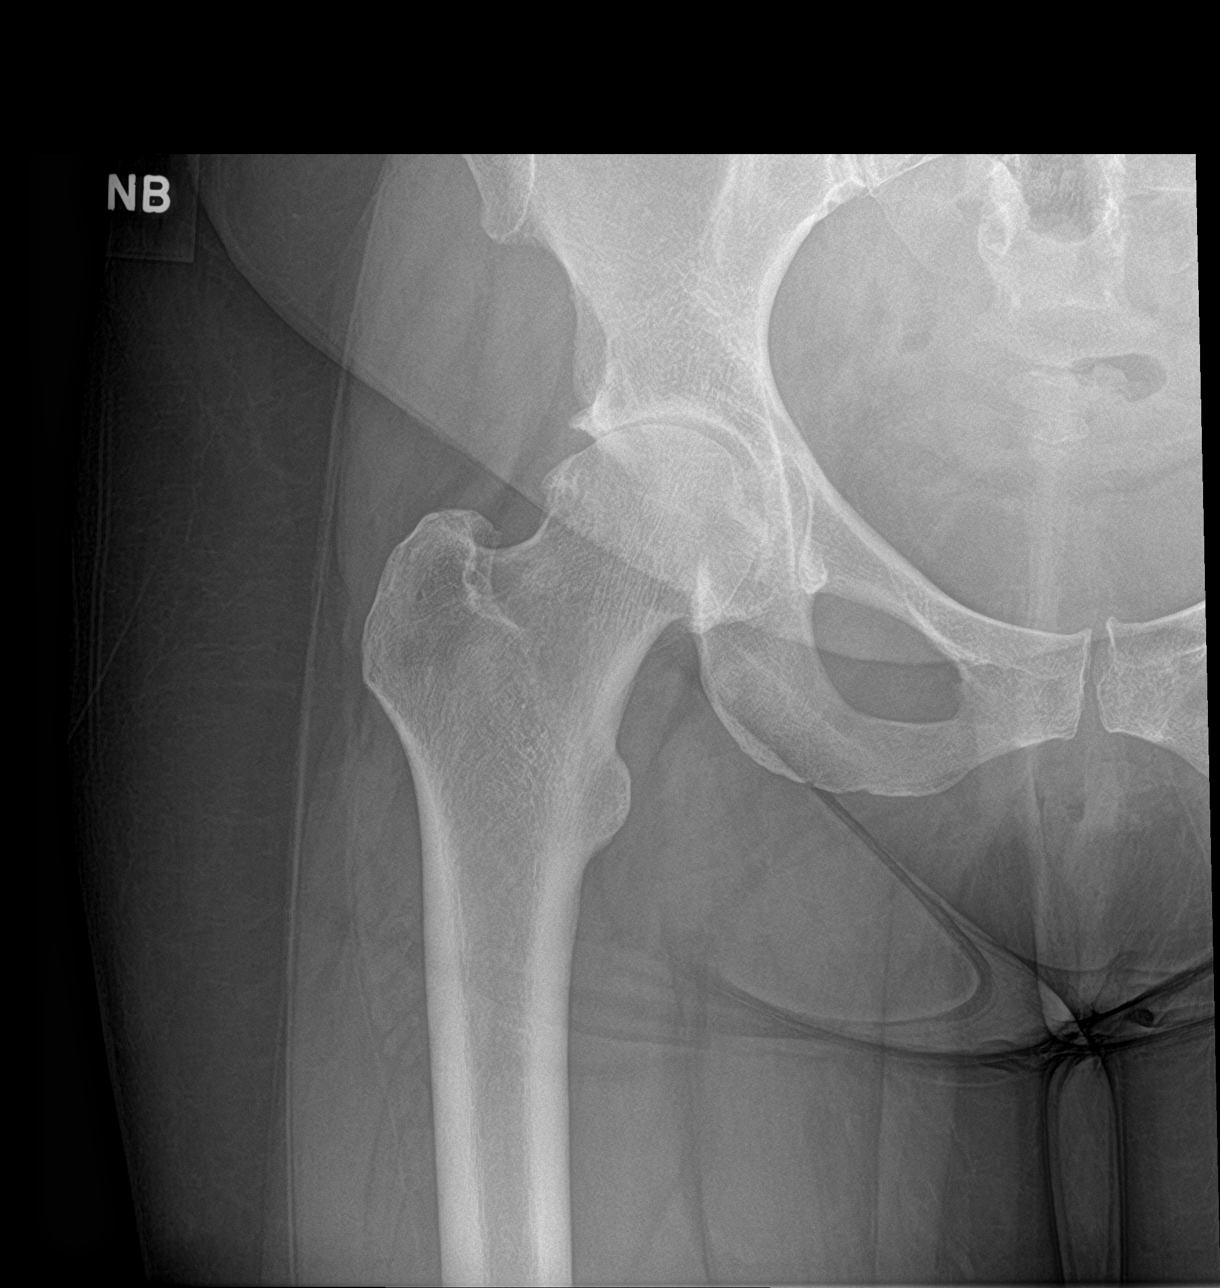

[hip lat]
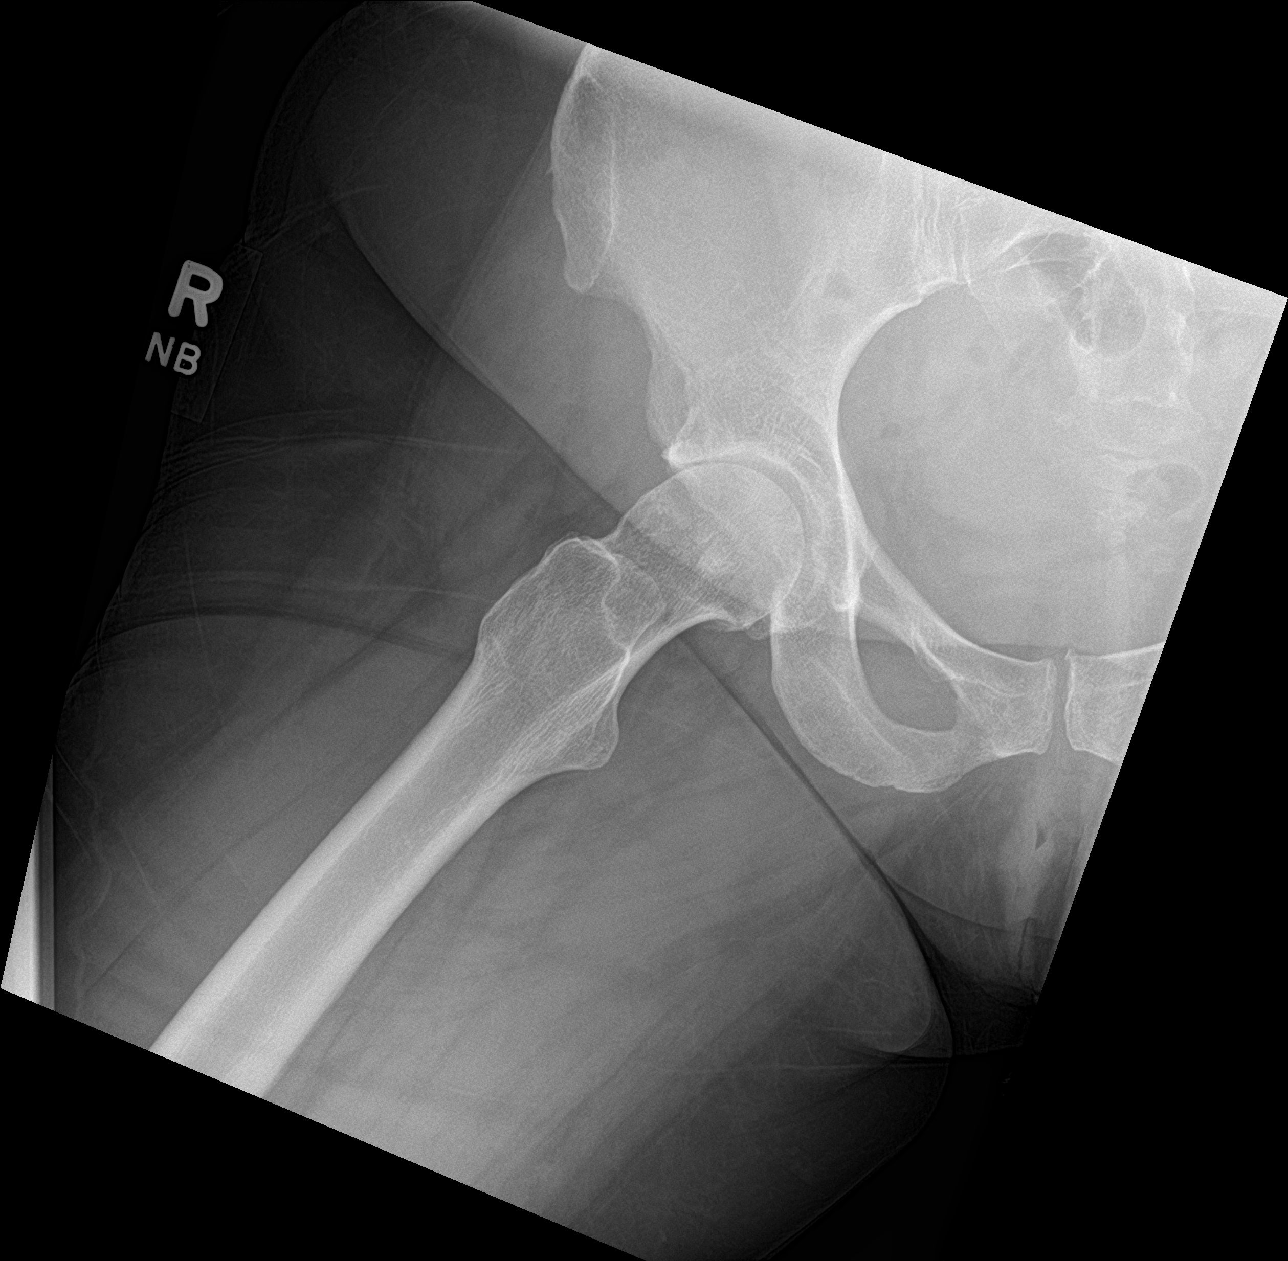

[3 of 3 positions shown; findings below may reference images not displayed]

FINDINGS: There is no evidence of hip fracture or dislocation. Abnormal
osseous prominence at the bilateral anterior superior femoral head
neck junction, more pronounced on the right, osseous morphology
which may predispose to femoroacetabular impingement (JOHNNY ALFREDO) in the
appropriate clinical setting. Interval progression of bilateral DJD
now moderate to severe on the right and moderate on the left.
IMPRESSION: Interval progression of bilateral hip DJD, now moderate to severe on
the right and moderate on the left.

## 2023-03-04 ENCOUNTER — Ambulatory Visit (INDEPENDENT_AMBULATORY_CARE_PROVIDER_SITE_OTHER): Payer: Commercial Managed Care - PPO | Admitting: Podiatry

## 2023-03-04 ENCOUNTER — Ambulatory Visit (INDEPENDENT_AMBULATORY_CARE_PROVIDER_SITE_OTHER): Payer: Commercial Managed Care - PPO

## 2023-03-04 ENCOUNTER — Encounter: Payer: Self-pay | Admitting: Podiatry

## 2023-03-04 DIAGNOSIS — M778 Other enthesopathies, not elsewhere classified: Secondary | ICD-10-CM

## 2023-03-04 DIAGNOSIS — M7751 Other enthesopathy of right foot: Secondary | ICD-10-CM

## 2023-03-04 MED ORDER — MELOXICAM 15 MG PO TABS: 15.0000 mg | ORAL_TABLET | Freq: Every day | ORAL | 0 refills | Status: DC | PRN

## 2023-03-04 NOTE — Progress Notes (Unsigned)
 Subjective:   Patient ID: Julia Griffith, female   DOB: 53 y.o.   MRN: [id number]   HPI Chief Complaint  Patient presents with   Foot Pain    RM#11 Patient presents with right foot pain ongoing for several months no injury to foot not taking pain relievers.    53 year old female presents the office today with the above concerns.  She states to be having symptoms since October.  She said her stenosis is doing heavy weights and is a throbbing sensation.  She feels the swelling that she does not see significant swelling.  No recent treatment.  She is very active.  She previously did have orthotics in the past which caused more discomfort.   Review of Systems  All other systems reviewed and are negative.  Past Medical History:  Diagnosis Date   Osteoarthritis of right hip 10/19/2012   Postpartum depression     Past Surgical History:  Procedure Laterality Date   LEEP     Right TM repair     thumb and middle finger surgery     overuse injury     Current Outpatient Medications:    cholecalciferol (VITAMIN D) 1000 UNITS tablet, Take 1,000 Units by mouth daily., Disp: , Rfl:    fish oil-omega-3 fatty acids 1000 MG capsule, Take 2 g by mouth daily., Disp: , Rfl:    meloxicam (MOBIC) 15 MG tablet, Take 1 tablet (15 mg total) by mouth daily as needed for pain., Disp: 30 tablet, Rfl: 0   MISC NATURAL PRODUCTS PO, Take by mouth. Curamin, Disp: , Rfl:    Testosterone 25 MG/2.5GM (1%) GEL, Place onto the skin., Disp: , Rfl:    vitamin C (ASCORBIC ACID) 500 MG tablet, Take 500 mg by mouth daily., Disp: , Rfl:   No Known Allergies        Objective:  Physical Exam  General: AAO x3, NAD  Dermatological: Skin is warm, dry and supple bilateral. . There are no open sores, no preulcerative lesions, no rash or signs of infection present.  Vascular: Dorsalis Pedis artery and Posterior Tibial artery pedal pulses are 2/4 bilateral with immedate capillary fill time.  There is no pain with calf  compression, swelling, warmth, erythema.   Neruologic: Grossly intact via light touch bilateral.   Musculoskeletal: Majority discomfort is along the course the peroneal tendon inferior to the lateral malleolus, lateral aspect the foot through the fifth metatarsal base.  Clinically the tendon appears to be intact there is no sign of edema or erythema.  Flexor, extensor tendons appear to be intact.  Not able to appreciate any area pinpoint tenderness.  Bunion present.  Gait: Unassisted, Nonantalgic.       Assessment:   Tendinitis     Plan:  -Treatment options discussed including all alternatives, risks, and complications -Etiology of symptoms were discussed -X-rays were obtained and reviewed with the patient.  3 views of the foot were obtained.  No evidence of acute fracture.  On the AP view there is a well-corticated ossicle just adjacent to the foot metatarsal base which is not acute in appearance.  There is no evidence of acute fracture.  Bunions present as well as metatarsus adductus. -We discussed various shoe changes.  Also discussed a more accommodative type insert to help support her foot if needed.  She previously had different type of insoles that were causing more discomfort.  We discussed stretching, rehab exercises.  Discussed modifications to weights to help decrease pressure. Prescribed mobic. Discussed  side effects of the medication and directed to stop if any are to occur and call the office.  -Consider advanced imaging if symptoms persist.  Vivi Barrack DPM

## 2023-03-04 NOTE — Patient Instructions (Signed)
 Peroneal Tendinopathy Rehab Ask your health care provider which exercises are safe for you. Do exercises exactly as told by your health care provider and adjust them as directed. It is normal to feel mild stretching, pulling, tightness, or discomfort as you do these exercises. Stop right away if you feel sudden pain or your pain gets worse. Do not begin these exercises until told by your health care provider. Stretching and range-of-motion exercises These exercises warm up your muscles and joints. They can help improve the movement and flexibility of your ankle. They may also help to relieve pain and stiffness. Gastrocnemius and soleus stretch, standing This is an exercise in which you stand on a step and use your body weight to stretch your calf muscles. To do this exercise: Stand on the edge of a step on the ball of your left / right foot. The ball of your foot is on the walking surface, right under your toes. Keep your other foot firmly on the same step. Hold on to the wall, a railing, or a chair for balance. Slowly lift your other foot, allowing your body weight to press your left / right heel down over the edge of the step. You should feel a stretch in your left / right calf (gastrocnemius and soleus). Hold this position for __________ seconds. Return both feet to the step. Repeat this exercise with a slight bend in your left / right knee. Repeat __________ times with your left / right knee straight and __________ times with your left / right knee bent. Complete this exercise __________ times a day. Strengthening exercises These exercises build strength and endurance in your foot and ankle. Endurance is the ability to use your muscles for a long time, even after they get tired. Ankle dorsiflexion with band  Secure a rubber exercise band or tube to an object, such as a table leg, that will not move when the band is pulled. Secure the other end of the band around your left / right foot. Sit on  the floor. Face the object with your left / right leg extended. The band or tube should be slightly tense when your foot is relaxed. Slowly flex your left / right ankle and toes to bring your foot toward you (dorsiflexion). Hold this position for __________ seconds. Let the band or tube slowly pull your foot back to the starting position. Repeat __________ times. Complete this exercise __________ times a day. Ankle eversion  Sit on the floor with your legs straight out in front of you. Loop a rubber exercise band or tube around the ball of your left / right foot. The ball of your foot is on the walking surface, right under your toes. Hold the ends of the band in your hands. You can also secure the band to a stable object. The band or tube should be slightly tense when your foot is relaxed. Slowly push your foot outward, away from your other leg (eversion). Hold this position for __________ seconds. Slowly return your foot to the starting position. Repeat __________ times. Complete this exercise __________ times a day. Plantar flexion, standing This exercise is sometimes called a standing heel raise. Stand with your feet shoulder-width apart. Place your hands on a wall or table to steady yourself as needed. Try not to use it for support. Keep your weight spread evenly over the width of your feet while you slowly rise up on your toes (plantar flexion). If told by your health care provider: Shift your weight  toward your left / right leg until you feel challenged. Stand on your left / right leg only. Hold this position for __________ seconds. Repeat __________ times. Complete this exercise __________ times a day. Single leg stand  Without shoes, stand near a railing or in a doorway. You may hold on to the railing or doorframe as needed. Stand on your left / right foot. Keep your big toe down on the floor and try to keep your arch lifted. Do not roll to the outside of your foot. If this  exercise is too easy, you can try it with your eyes closed or while standing on a pillow. Hold this position for __________ seconds. Repeat __________ times. Complete this exercise __________ times a day. This information is not intended to replace advice given to you by your health care provider. Make sure you discuss any questions you have with your health care provider. Document Revised: 04/19/2021 Document Reviewed: 04/19/2021 Elsevier Patient Education  2024 [employer].

## 2023-04-02 ENCOUNTER — Other Ambulatory Visit: Payer: Self-pay | Admitting: Podiatry

## 2023-04-29 ENCOUNTER — Encounter: Payer: Self-pay | Admitting: Podiatry

## 2023-04-29 ENCOUNTER — Ambulatory Visit (INDEPENDENT_AMBULATORY_CARE_PROVIDER_SITE_OTHER): Payer: Commercial Managed Care - PPO | Admitting: Podiatry

## 2023-04-29 DIAGNOSIS — M7751 Other enthesopathy of right foot: Secondary | ICD-10-CM

## 2023-04-29 MED ORDER — NITROGLYCERIN 0.2 MG/HR TD PT24: 0.2000 mg | MEDICATED_PATCH | Freq: Every day | TRANSDERMAL | 0 refills | Status: DC

## 2023-04-29 NOTE — Progress Notes (Unsigned)
 Subjective:   Patient ID: Julia Griffith, female   DOB: 53 y.o.   MRN: [id number]   HPI Chief Complaint  Patient presents with   Foot Pain    RM#14 Follow up on right foot pain has been in physical therapy not giving relief and starting to experience pain on left foot same as right.No relief from medication treatment as prescribed.    53 year old female presents the office today with the above concerns.  She reports to me that she thinks it physical hit me might be starting to help a little bit.  She is working on [employer] as well.  Overall her symptoms she reports this somewhat improved but she still having discomfort.  No recent injury or changes otherwise.       Objective:  Physical Exam  General: AAO x3, NAD  Dermatological: Skin is warm, dry and supple bilateral. . There are no open sores, no preulcerative lesions, no rash or signs of infection present.  Vascular: Dorsalis Pedis artery and Posterior Tibial artery pedal pulses are 2/4 bilateral with immedate capillary fill time.  There is no pain with calf compression, swelling, warmth, erythema.   Neruologic: Grossly intact via light touch bilateral.   Musculoskeletal: Majority discomfort is still noted along the course the peroneal tendon inferior to the lateral malleolus, lateral aspect the foot through the fifth metatarsal base.  Clinically the tendon appears to be intact there is no sign of edema or erythema.  Flexor, extensor tendons appear to be intact.  Not able to appreciate any area pinpoint tenderness.  Bunion present.  Gait: Unassisted, Nonantalgic.       Assessment:   Tendinitis     Plan:  -Treatment options discussed including all alternatives, risks, and complications -Etiology of symptoms were discussed -Overall symptoms more consistent with peroneal tendinitis.  She has a couple more sessions of physical therapy for her to continue with this they are doing dry needling, electrical stimulation  treatments.  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  I prescribed nitro patches and discussed this is an off label use.  Meloxicam  is not helping she was told of any other anti-inflammatories.  She is been using turmeric.  If symptoms persist we discussed shockwave treatment in the office.  Julia Griffith DPM

## 2023-09-09 ENCOUNTER — Encounter: Payer: Self-pay | Admitting: Sports Medicine

## 2023-10-27 ENCOUNTER — Telehealth: Payer: Self-pay | Admitting: Lab

## 2023-10-27 NOTE — Telephone Encounter (Signed)
 Patient would like an order for orthotics and will get them in [place] states just needs order.

## 2023-10-28 ENCOUNTER — Other Ambulatory Visit: Payer: Self-pay | Admitting: Podiatry

## 2023-10-28 DIAGNOSIS — M7751 Other enthesopathy of right foot: Secondary | ICD-10-CM

## 2023-10-28 DIAGNOSIS — M778 Other enthesopathies, not elsewhere classified: Secondary | ICD-10-CM

## 2023-10-29 NOTE — Telephone Encounter (Signed)
 Patient is aware.

## 2023-12-17 ENCOUNTER — Encounter: Payer: Self-pay | Admitting: Urgent Care

## 2023-12-17 ENCOUNTER — Ambulatory Visit: Payer: Self-pay | Admitting: *Deleted

## 2023-12-17 ENCOUNTER — Ambulatory Visit: Payer: Self-pay | Admitting: Urgent Care

## 2023-12-17 ENCOUNTER — Ambulatory Visit (INDEPENDENT_AMBULATORY_CARE_PROVIDER_SITE_OTHER): Admitting: Urgent Care

## 2023-12-17 ENCOUNTER — Ambulatory Visit

## 2023-12-17 VITALS — BP 150/85 | HR 62 | Ht 62.0 in | Wt 162.0 lb

## 2023-12-17 DIAGNOSIS — S56911A Strain of unspecified muscles, fascia and tendons at forearm level, right arm, initial encounter: Secondary | ICD-10-CM

## 2023-12-17 DIAGNOSIS — R030 Elevated blood-pressure reading, without diagnosis of hypertension: Secondary | ICD-10-CM

## 2023-12-17 DIAGNOSIS — S46911A Strain of unspecified muscle, fascia and tendon at shoulder and upper arm level, right arm, initial encounter: Secondary | ICD-10-CM

## 2023-12-17 MED ORDER — DICLOFENAC SODIUM 75 MG PO TBEC: 75.0000 mg | DELAYED_RELEASE_TABLET | Freq: Two times a day (BID) | ORAL | 0 refills | Status: AC

## 2023-12-17 NOTE — Telephone Encounter (Signed)
 Patient scheduled today 12/10/225 with Julia Griffith

## 2023-12-17 NOTE — Progress Notes (Signed)
 Established Patient Office Visit  Subjective:  Patient ID: Julia Griffith, female    DOB: [birth year]  Age: 53 y.o. MRN: [id number]  Chief Complaint  Patient presents with   Arm Pain    Injury yesterday (pulling on a trunk) Ibuprofen and tylenol alternating with icy hot; any movement hurts; pain centralized in the forearm and bicep with swelling in the hand and fingers    HPI  Discussed the use of AI scribe software for clinical note transcription with the patient, who gave verbal consent to proceed.  History of Present Illness   Julia Griffith is a 53 year old female who presents with right arm pain following weight lifting and carrying heavy objects.  She experiences sharp, excruciating pain in her right arm, extending from the forearm to the top of the arm, after lifting weights and carrying a heavy box of cans. The pain began when she attempted to open the trunk of her car. Initially localized in the forearm, the pain later became more pronounced in the upper arm.  She has been taking Tylenol and ibuprofen, which have made the pain more tolerable but have not eliminated it. She also applied Deep Blue, an essential oil-based topical treatment, for relief. Despite these measures, she reports significant weakness in the arm, stating she has 'basically no strength' and experiences pain with supination and wrist extension.  No previous similar episodes and no recent use of antibiotics like fluoroquinolones. Her hand is warm and swollen, though there is no pain in the hand itself. She experiences tightness in her fingers due to swelling and has difficulty with certain movements, such as flipping her hand over and squeezing.  No numbness, tingling, or sharp shooting pains down the arm. The pain does not radiate down the arm. This type of workout is routine for her and not a new activity.      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   Allergic rhinitis 05/29/2009   BACK PAIN, THORACIC REGION 03/15/2009   FOOT PAIN, LEFT 01/18/2009   IRON DEFICIENCY 11/16/2007   DIZZINESS 11/16/2007   Past Medical History:  Diagnosis Date   Osteoarthritis of right hip 10/19/2012   Postpartum depression    Past Surgical History:  Procedure Laterality Date   LEEP     Right TM repair     thumb and middle finger surgery     overuse injury   Social History   Tobacco Use   Smoking status: Former    Current packs/day: 0.00    Types: Cigarettes    Quit date: 01/08/2000    Years since quitting: 23.9   Smokeless tobacco: Never  Substance Use Topics   Alcohol use: Yes   Drug use: No      ROS: as noted in HPI  Objective:     BP (!) 150/85   Pulse 62   Ht 5' 2 (1.575 m)   Wt 162 lb (73.5 kg)   SpO2 96%   BMI 29.63 kg/m  BP Readings from Last 3 Encounters:  12/17/23 (!) 150/85  09/18/21 (!) 139/58  06/01/20 (!) 146/78   Wt Readings from Last 3 Encounters:  12/17/23 162 lb (73.5 kg)  09/18/21 155 lb 1.9 oz (70.4 kg)  06/01/20 157 lb (71.2 kg)      Physical Exam Vitals and nursing  note reviewed. Exam conducted with a chaperone present.  Constitutional:      General: She is not in acute distress.    Appearance: Normal appearance. She is not ill-appearing, toxic-appearing or diaphoretic.  HENT:     Head: Normocephalic and atraumatic.  Cardiovascular:     Rate and Rhythm: Normal rate and regular rhythm.  Pulmonary:     Effort: Pulmonary effort is normal. No respiratory distress.  Musculoskeletal:        General: Swelling (mild to R forearm) and tenderness present. No deformity.     Right shoulder: Tenderness present. No deformity, effusion or bony tenderness. Decreased range of motion. Decreased strength. Normal pulse.     Right upper arm: Tenderness present. No bony tenderness.     Right  elbow: Normal. No swelling, deformity, effusion or lacerations. Normal range of motion.     Right forearm: Swelling and tenderness present. No deformity, lacerations or bony tenderness.     Right wrist: No tenderness, bony tenderness or snuff box tenderness. Normal range of motion.     Right hand: Decreased strength. Normal strength of finger abduction and wrist extension.       Arms:     Cervical back: No rigidity or tenderness.     Comments: Negative yergason, speed, obrien, sulcus, neer, hawkins test  Neurological:     General: No focal deficit present.     Mental Status: She is alert and oriented to person, place, and time.     Motor: Weakness (to R upper extremity, particularly with supination) present.      No results found for any visits on 12/17/23.  Last CBC Lab Results  Component Value Date   WBC 7.1 08/03/2019   HGB 13.5 08/03/2019   HCT 39.4 08/03/2019   MCV 92.3 08/03/2019   MCH 31.6 08/03/2019   RDW 11.7 08/03/2019   PLT 271 08/03/2019   Last metabolic panel Lab Results  Component Value Date   GLUCOSE 93 08/03/2019   NA 137 08/03/2019   K 4.8 08/03/2019   CL 104 08/03/2019   CO2 25 08/03/2019   BUN 13 08/03/2019   CREATININE 0.76 08/03/2019   GFRNONAA 94 08/03/2019   CALCIUM 9.0 08/03/2019   PROT 6.6 08/03/2019   ALBUMIN 3.9 03/24/2014   BILITOT 0.4 08/03/2019   ALKPHOS 49 03/24/2014   AST 14 08/03/2019   ALT 10 08/03/2019   Last lipids Lab Results  Component Value Date   CHOL 198 08/03/2019   HDL 56 08/03/2019   LDLCALC 117 (H) 08/03/2019   TRIG 141 08/03/2019   CHOLHDL 3.5 08/03/2019   Last hemoglobin A1c Lab Results  Component Value Date   HGBA1C 4.8 12/17/2012   Last thyroid functions Lab Results  Component Value Date   TSH 1.053 11/17/2007   Last vitamin D No results found for: 25OHVITD2, 25OHVITD3, VD25OH Last vitamin B12 and Folate No results found for: VITAMINB12, FOLATE    The ASCVD Risk score (Arnett DK, et  al., 2019) failed to calculate for the following reasons:   Cannot find a previous HDL lab   Cannot find a previous total cholesterol lab   * - Cholesterol units were assumed  Assessment & Plan:  Strain of right shoulder, initial encounter -     Diclofenac  Sodium; Take 1 tablet (75 mg total) by mouth 2 (two) times daily with a meal.  Dispense: 30 tablet; Refill: 0 -     DG Shoulder Right; Future  Strain of right forearm, initial encounter -  Diclofenac  Sodium; Take 1 tablet (75 mg total) by mouth 2 (two) times daily with a meal.  Dispense: 30 tablet; Refill: 0 -     DG Elbow 2 Views Right; Future  Elevated blood pressure reading without diagnosis of hypertension  Assessment and Plan    Right shoulder and forearm muscle/tendon strain Acute pain post-lifting, likely muscular strain or sprain. Ruled out biceps tendon rupture due to lack of bruising or deformity. Imaging needed to exclude avulsion fracture. - Ordered x-ray to rule out avulsion fracture and assess musculoskeletal swelling. - Prescribed prescription-strength anti-inflammatory twice daily with food. - Advised rest and avoidance of upper arm exercises for 1-2 weeks. - Recommended ice application to reduce swelling. - Suggested lidocaine patch for pain relief. - Scheduled follow-up in 2 weeks to assess recovery and determine need for further imaging or physical therapy.      Elevated BP in office Likely secondary to pain. Monitor at home, recheck in two weeks.    Return in about 2 weeks (around 12/31/2023).   Benton LITTIE Gave, PA

## 2023-12-17 NOTE — Telephone Encounter (Signed)
 FYI Only or Action Required?: FYI only for provider: appointment scheduled on 12/10.  Patient was last seen in primary care on 09/18/2021 by Alvan Dorothyann BIRCH, MD.  Called Nurse Triage reporting Arm Swelling (Arm pain, swelling).  Symptoms began yesterday.  Interventions attempted: OTC medications: Aleve,tylenol.  Symptoms are: gradually worsening.  Triage Disposition: Go to ED Now (or PCP Triage)  Patient/caregiver understands and will follow disposition?: YesCopied from CRM #[id number]. Topic: Clinical - Red Word Triage >> Dec 17, 2023  7:54 AM Berwyn MATSU wrote: Red Word that prompted transfer to Nurse Triage: right arm fingers, bicep and forearm swollen and hot to touch, painful. Reason for Disposition  [1] SEVERE pain (e.g., excruciating) AND [2] not improved 2 hours after pain medicine  Answer Assessment - Initial Assessment Questions 1. ONSET: When did the pain start?     Yesterday- patient may have injured pulling' trunk open 2. LOCATION: Where is the pain located?     Forearm, bicep 3. PAIN: How bad is the pain? (Scale 0-10; or none, mild, moderate, severe)     10/10 4. WORK OR EXERCISE: Has there been any recent work or exercise that involved this part of the body?     See above 5. CAUSE: What do you think is causing the arm pain?     injury 6. OTHER SYMPTOMS: Do you have any other symptoms? (e.g., neck pain, swelling, rash, fever, numbness, weakness)     Swelling in the finger,  heat- warm to touch  Protocols used: Arm Pain-A-AH

## 2023-12-18 NOTE — Patient Instructions (Signed)
 Please take the anti-inflammatory medication called in today twice daily with food. Do not take any additional OTC NSAIDS (advil, motrin, ibuprofen, aleve, naproxen).    Use an OTC lidocaine patch to the areas of pain. Can wear for up to 12 hours.  Avoid heavy lifting or routine repetitive movements of the forearm/ shoulder.  Please complete xray imaging in [address].  Return for follow up in 2 weeks, sooner if any new symptoms develop.  Monitor BP at home, goal 120/80
